# Patient Record
Sex: Female | Born: 1980 | Race: White | Hispanic: No | Marital: Single | Smoking: Former smoker
Health system: Southern US, Community
[De-identification: ages and names within clinical notes are randomized; demographics above are authoritative.]

## PROBLEM LIST (undated history)

## (undated) DIAGNOSIS — Z8709 Personal history of other diseases of the respiratory system: Secondary | ICD-10-CM

## (undated) DIAGNOSIS — M5412 Radiculopathy, cervical region: Secondary | ICD-10-CM

## (undated) DIAGNOSIS — F32A Depression, unspecified: Secondary | ICD-10-CM

## (undated) DIAGNOSIS — Z8719 Personal history of other diseases of the digestive system: Secondary | ICD-10-CM

## (undated) DIAGNOSIS — M47812 Spondylosis without myelopathy or radiculopathy, cervical region: Secondary | ICD-10-CM

## (undated) DIAGNOSIS — F419 Anxiety disorder, unspecified: Secondary | ICD-10-CM

## (undated) DIAGNOSIS — G9520 Unspecified cord compression: Secondary | ICD-10-CM

## (undated) DIAGNOSIS — K219 Gastro-esophageal reflux disease without esophagitis: Secondary | ICD-10-CM

## (undated) DIAGNOSIS — G950 Syringomyelia and syringobulbia: Secondary | ICD-10-CM

## (undated) DIAGNOSIS — K649 Unspecified hemorrhoids: Secondary | ICD-10-CM

## (undated) DIAGNOSIS — E559 Vitamin D deficiency, unspecified: Secondary | ICD-10-CM

## (undated) DIAGNOSIS — G43909 Migraine, unspecified, not intractable, without status migrainosus: Secondary | ICD-10-CM

## (undated) DIAGNOSIS — E781 Pure hyperglyceridemia: Secondary | ICD-10-CM

## (undated) DIAGNOSIS — F329 Major depressive disorder, single episode, unspecified: Secondary | ICD-10-CM

## (undated) DIAGNOSIS — M5020 Other cervical disc displacement, unspecified cervical region: Secondary | ICD-10-CM

## (undated) DIAGNOSIS — Z8639 Personal history of other endocrine, nutritional and metabolic disease: Secondary | ICD-10-CM

## (undated) DIAGNOSIS — K2970 Gastritis, unspecified, without bleeding: Secondary | ICD-10-CM

## (undated) DIAGNOSIS — S22000A Wedge compression fracture of unspecified thoracic vertebra, initial encounter for closed fracture: Secondary | ICD-10-CM

## (undated) DIAGNOSIS — B9681 Helicobacter pylori [H. pylori] as the cause of diseases classified elsewhere: Secondary | ICD-10-CM

## (undated) DIAGNOSIS — M47816 Spondylosis without myelopathy or radiculopathy, lumbar region: Secondary | ICD-10-CM

## (undated) DIAGNOSIS — O159 Eclampsia, unspecified as to time period: Secondary | ICD-10-CM

## (undated) DIAGNOSIS — IMO0002 Reserved for concepts with insufficient information to code with codable children: Secondary | ICD-10-CM

## (undated) DIAGNOSIS — E119 Type 2 diabetes mellitus without complications: Secondary | ICD-10-CM

## (undated) HISTORY — DX: Radiculopathy, cervical region: M54.12

## (undated) HISTORY — DX: Spondylosis without myelopathy or radiculopathy, cervical region: M47.812

## (undated) HISTORY — DX: Personal history of other diseases of the respiratory system: Z87.09

## (undated) HISTORY — DX: Reserved for concepts with insufficient information to code with codable children: IMO0002

## (undated) HISTORY — DX: Syringomyelia and syringobulbia: G95.0

## (undated) HISTORY — DX: Major depressive disorder, single episode, unspecified: F32.9

## (undated) HISTORY — DX: Spondylosis without myelopathy or radiculopathy, lumbar region: M47.816

## (undated) HISTORY — DX: Wedge compression fracture of unspecified thoracic vertebra, initial encounter for closed fracture: S22.000A

## (undated) HISTORY — DX: Eclampsia, unspecified as to time period: O15.9

## (undated) HISTORY — DX: Helicobacter pylori (H. pylori) as the cause of diseases classified elsewhere: B96.81

## (undated) HISTORY — DX: Pure hyperglyceridemia: E78.1

## (undated) HISTORY — DX: Migraine, unspecified, not intractable, without status migrainosus: G43.909

## (undated) HISTORY — DX: Depression, unspecified: F32.A

## (undated) HISTORY — DX: Vitamin D deficiency, unspecified: E55.9

## (undated) HISTORY — DX: Type 2 diabetes mellitus without complications: E11.9

## (undated) HISTORY — DX: Personal history of other endocrine, nutritional and metabolic disease: Z86.39

## (undated) HISTORY — DX: Gastritis, unspecified, without bleeding: K29.70

## (undated) HISTORY — DX: Personal history of other diseases of the digestive system: Z87.19

## (undated) HISTORY — DX: Unspecified cord compression: G95.20

## (undated) HISTORY — PX: WISDOM TOOTH EXTRACTION: SHX21

---
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
## 1998-01-27 ENCOUNTER — Inpatient Hospital Stay (HOSPITAL_COMMUNITY): Admission: AD | Admit: 1998-01-27 | Discharge: 1998-01-27 | Payer: Self-pay | Admitting: Obstetrics and Gynecology

## 1998-01-30 ENCOUNTER — Ambulatory Visit (HOSPITAL_COMMUNITY): Admission: RE | Admit: 1998-01-30 | Discharge: 1998-01-30 | Payer: Self-pay | Admitting: Obstetrics and Gynecology

## 1999-09-24 ENCOUNTER — Other Ambulatory Visit: Admission: RE | Admit: 1999-09-24 | Discharge: 1999-09-24 | Payer: Self-pay | Admitting: Obstetrics and Gynecology

## 1999-12-25 ENCOUNTER — Inpatient Hospital Stay (HOSPITAL_COMMUNITY): Admission: AD | Admit: 1999-12-25 | Discharge: 1999-12-25 | Payer: Self-pay | Admitting: Obstetrics and Gynecology

## 2000-02-19 ENCOUNTER — Encounter: Admission: RE | Admit: 2000-02-19 | Discharge: 2000-05-19 | Payer: Self-pay | Admitting: Obstetrics and Gynecology

## 2000-04-10 ENCOUNTER — Inpatient Hospital Stay (HOSPITAL_COMMUNITY): Admission: AD | Admit: 2000-04-10 | Discharge: 2000-04-12 | Payer: Self-pay | Admitting: Obstetrics and Gynecology

## 2002-02-07 ENCOUNTER — Other Ambulatory Visit: Admission: RE | Admit: 2002-02-07 | Discharge: 2002-02-07 | Payer: Self-pay | Admitting: Obstetrics and Gynecology

## 2002-06-21 ENCOUNTER — Inpatient Hospital Stay (HOSPITAL_COMMUNITY): Admission: AD | Admit: 2002-06-21 | Discharge: 2002-06-21 | Payer: Self-pay | Admitting: Obstetrics and Gynecology

## 2002-07-17 ENCOUNTER — Inpatient Hospital Stay (HOSPITAL_COMMUNITY): Admission: AD | Admit: 2002-07-17 | Discharge: 2002-07-17 | Payer: Self-pay | Admitting: Obstetrics and Gynecology

## 2002-07-27 ENCOUNTER — Inpatient Hospital Stay (HOSPITAL_COMMUNITY): Admission: AD | Admit: 2002-07-27 | Discharge: 2002-07-28 | Payer: Self-pay | Admitting: Obstetrics and Gynecology

## 2002-09-15 ENCOUNTER — Other Ambulatory Visit: Admission: RE | Admit: 2002-09-15 | Discharge: 2002-09-15 | Payer: Self-pay | Admitting: Obstetrics and Gynecology

## 2004-09-20 DIAGNOSIS — M5134 Other intervertebral disc degeneration, thoracic region: Secondary | ICD-10-CM | POA: Insufficient documentation

## 2005-04-07 ENCOUNTER — Other Ambulatory Visit: Admission: RE | Admit: 2005-04-07 | Discharge: 2005-04-07 | Payer: Self-pay | Admitting: Obstetrics and Gynecology

## 2005-08-27 ENCOUNTER — Encounter: Admission: RE | Admit: 2005-08-27 | Discharge: 2005-08-27 | Payer: Self-pay | Admitting: Obstetrics and Gynecology

## 2005-08-28 ENCOUNTER — Ambulatory Visit (HOSPITAL_COMMUNITY): Admission: RE | Admit: 2005-08-28 | Discharge: 2005-08-28 | Payer: Self-pay | Admitting: Obstetrics and Gynecology

## 2005-10-08 ENCOUNTER — Inpatient Hospital Stay (HOSPITAL_COMMUNITY): Admission: AD | Admit: 2005-10-08 | Discharge: 2005-10-11 | Payer: Self-pay | Admitting: Obstetrics and Gynecology

## 2005-10-20 ENCOUNTER — Inpatient Hospital Stay (HOSPITAL_COMMUNITY): Admission: AD | Admit: 2005-10-20 | Discharge: 2005-10-22 | Payer: Self-pay | Admitting: Obstetrics and Gynecology

## 2005-10-20 ENCOUNTER — Encounter: Payer: Self-pay | Admitting: Emergency Medicine

## 2010-05-05 DIAGNOSIS — Z8709 Personal history of other diseases of the respiratory system: Secondary | ICD-10-CM

## 2010-05-05 HISTORY — DX: Personal history of other diseases of the respiratory system: Z87.09

## 2010-10-29 ENCOUNTER — Inpatient Hospital Stay (INDEPENDENT_AMBULATORY_CARE_PROVIDER_SITE_OTHER)
Admission: RE | Admit: 2010-10-29 | Discharge: 2010-10-29 | Disposition: A | Discharge status: Home / Self Care | Payer: Self-pay | Source: Ambulatory Visit | Attending: Emergency Medicine | Admitting: Emergency Medicine

## 2010-10-29 DIAGNOSIS — N760 Acute vaginitis: Secondary | ICD-10-CM

## 2010-10-29 LAB — POCT URINALYSIS DIP (DEVICE)
Protein, ur: NEGATIVE mg/dL
Specific Gravity, Urine: 1.015 (ref 1.005–1.030)

## 2010-10-29 LAB — WET PREP, GENITAL: Yeast Wet Prep HPF POC: NONE SEEN

## 2010-12-06 ENCOUNTER — Inpatient Hospital Stay (INDEPENDENT_AMBULATORY_CARE_PROVIDER_SITE_OTHER)
Admission: RE | Admit: 2010-12-06 | Discharge: 2010-12-06 | Disposition: A | Discharge status: Home / Self Care | Payer: Medicaid Other | Source: Ambulatory Visit | Attending: Emergency Medicine | Admitting: Emergency Medicine

## 2010-12-06 DIAGNOSIS — N898 Other specified noninflammatory disorders of vagina: Secondary | ICD-10-CM

## 2010-12-06 LAB — WET PREP, GENITAL
Trich, Wet Prep: NONE SEEN
Yeast Wet Prep HPF POC: NONE SEEN

## 2010-12-07 LAB — GC/CHLAMYDIA PROBE AMP, GENITAL: GC Probe Amp, Genital: NEGATIVE

## 2011-02-21 DIAGNOSIS — M4800 Spinal stenosis, site unspecified: Secondary | ICD-10-CM | POA: Insufficient documentation

## 2011-12-25 ENCOUNTER — Other Ambulatory Visit (HOSPITAL_COMMUNITY): Payer: Self-pay | Admitting: Neurosurgery

## 2011-12-25 DIAGNOSIS — G950 Syringomyelia and syringobulbia: Secondary | ICD-10-CM

## 2011-12-29 ENCOUNTER — Other Ambulatory Visit (HOSPITAL_COMMUNITY): Payer: Medicaid Other

## 2011-12-31 ENCOUNTER — Ambulatory Visit (HOSPITAL_COMMUNITY)
Admission: RE | Admit: 2011-12-31 | Discharge: 2011-12-31 | Disposition: A | Discharge status: Home / Self Care | Payer: 59 | Source: Ambulatory Visit | Attending: Neurosurgery | Admitting: Neurosurgery

## 2011-12-31 DIAGNOSIS — G950 Syringomyelia and syringobulbia: Secondary | ICD-10-CM

## 2011-12-31 DIAGNOSIS — M4802 Spinal stenosis, cervical region: Secondary | ICD-10-CM | POA: Insufficient documentation

## 2011-12-31 DIAGNOSIS — IMO0002 Reserved for concepts with insufficient information to code with codable children: Secondary | ICD-10-CM | POA: Insufficient documentation

## 2012-08-29 ENCOUNTER — Encounter (HOSPITAL_COMMUNITY): Payer: Self-pay | Admitting: *Deleted

## 2012-08-29 ENCOUNTER — Emergency Department (HOSPITAL_COMMUNITY)
Admission: EM | Admit: 2012-08-29 | Discharge: 2012-08-30 | Disposition: A | Discharge status: Home / Self Care | Payer: 59 | Attending: Emergency Medicine | Admitting: Emergency Medicine

## 2012-08-29 DIAGNOSIS — Z79899 Other long term (current) drug therapy: Secondary | ICD-10-CM | POA: Insufficient documentation

## 2012-08-29 DIAGNOSIS — Z299 Encounter for prophylactic measures, unspecified: Secondary | ICD-10-CM

## 2012-08-29 DIAGNOSIS — G40909 Epilepsy, unspecified, not intractable, without status epilepticus: Secondary | ICD-10-CM | POA: Insufficient documentation

## 2012-08-29 DIAGNOSIS — M79604 Pain in right leg: Secondary | ICD-10-CM

## 2012-08-29 DIAGNOSIS — M79609 Pain in unspecified limb: Secondary | ICD-10-CM | POA: Insufficient documentation

## 2012-08-29 DIAGNOSIS — F17200 Nicotine dependence, unspecified, uncomplicated: Secondary | ICD-10-CM | POA: Insufficient documentation

## 2012-08-29 DIAGNOSIS — I82409 Acute embolism and thrombosis of unspecified deep veins of unspecified lower extremity: Secondary | ICD-10-CM | POA: Insufficient documentation

## 2012-08-29 LAB — POCT I-STAT, CHEM 8
BUN: 5 mg/dL — ABNORMAL LOW (ref 6–23)
Calcium, Ion: 1.16 mmol/L (ref 1.12–1.23)
Chloride: 104 mEq/L (ref 96–112)
Creatinine, Ser: 0.8 mg/dL (ref 0.50–1.10)
Glucose, Bld: 121 mg/dL — ABNORMAL HIGH (ref 70–99)
HCT: 45 % (ref 36.0–46.0)
Hemoglobin: 15.3 g/dL — ABNORMAL HIGH (ref 12.0–15.0)
Potassium: 4 mEq/L (ref 3.5–5.1)
Sodium: 141 mEq/L (ref 135–145)
TCO2: 26 mmol/L (ref 0–100)

## 2012-08-29 LAB — CBC
HCT: 42.3 % (ref 36.0–46.0)
Hemoglobin: 14.9 g/dL (ref 12.0–15.0)
MCH: 28.5 pg (ref 26.0–34.0)
MCHC: 35.2 g/dL (ref 30.0–36.0)
MCV: 81 fL (ref 78.0–100.0)
Platelets: 245 10*3/uL (ref 150–400)
RBC: 5.22 MIL/uL — ABNORMAL HIGH (ref 3.87–5.11)

## 2012-08-29 LAB — PROTIME-INR
INR: 0.92 (ref 0.00–1.49)
Prothrombin Time: 12.3 seconds (ref 11.6–15.2)

## 2012-08-29 MED ORDER — ENOXAPARIN SODIUM 100 MG/ML ~~LOC~~ SOLN
90.0000 mg | SUBCUTANEOUS | Status: AC
  Administered 2012-08-29: 90 mg via SUBCUTANEOUS
  Filled 2012-08-29: qty 1

## 2012-08-29 NOTE — ED Notes (Signed)
The pt has pain and redness x2 to the rt lower leg since yesterday.  The pain has increased since yesterday.

## 2012-08-29 NOTE — ED Provider Notes (Signed)
History    This chart was scribed for Jenna Taylor, a non-physician practitioner working with Jenna Razor, MD by Jenna Taylor, ED Scribe. This patient was seen in room TR11C/TR11C and the patient's care was started at 2237.     CSN: [id number]  Arrival date & time 08/29/12  4098   First MD Initiated Contact with Patient 08/29/12 2218      Chief Complaint  Patient presents with  . Leg Pain    (Consider location/radiation/quality/duration/timing/severity/associated sxs/prior treatment) The history is provided by the patient.   Jenna Taylor is a 32 y.o. female who presents to the Emergency Department complaining of worsening waxing and waning right lower leg pain onset last night while at work last night. Pt reports pain felt like "long charlie horse" lasting for 2 hours this evening while driving to work. Pt denies fever, chest pain, and shortness of breath. Pt denies recent travels, but states she drives 45 minutes to work everyday. Pt reports pain is leg pain is aggravated with ambulation. Pt denies any alleviating factors. Pt reports she's on Mirena for birth control. Pt denies recent surgery, and injuries. Pt denies taking any medications at home to treat pain. Pt reports family hx of blood clots. Pt reports she recently quit smoking.    Past Medical History  Diagnosis Date  . Seizures     History reviewed. No pertinent past surgical history.  No family history on file.  History  Substance Use Topics  . Smoking status: Current Every Day Smoker  . Smokeless tobacco: Not on file  . Alcohol Use: Yes    OB History   Grav Para Term Preterm Abortions TAB SAB Ect Mult Living                  Review of Systems A complete 10 system review of systems was obtained and all systems are negative except as noted in the HPI and PMH.    Allergies  Oxycodone and Penicillins  Home Medications   Current Outpatient Rx  Name  Route  Sig  Dispense  Refill  .  citalopram (CELEXA) 40 MG tablet   Oral   Take 40 mg by mouth daily.         . fexofenadine-pseudoephedrine (ALLEGRA-D 24) 180-240 MG per 24 hr tablet   Oral   Take 1 tablet by mouth daily.         Marland Kitchen HYDROcodone-acetaminophen (VICODIN) 5-500 MG per tablet   Oral   Take 1 tablet by mouth every 6 (six) hours as needed for pain.         . meloxicam (MOBIC) 15 MG tablet   Oral   Take 15 mg by mouth daily as needed for pain.         . metaxalone (SKELAXIN) 800 MG tablet   Oral   Take 800 mg by mouth 4 (four) times daily as needed for pain.           BP 129/76  Pulse 101  Temp(Src) 99.2 F (37.3 C)  Resp 18  SpO2 98%  Physical Exam  Nursing note and vitals reviewed. Constitutional: She is oriented to person, place, and time. She appears well-developed and well-nourished. No distress.  HENT:  Head: Normocephalic and atraumatic.  Eyes: EOM are normal.  Neck: Neck supple. No tracheal deviation present.  Cardiovascular: Normal rate and intact distal pulses.   Pulmonary/Chest: Effort normal. No respiratory distress.  Musculoskeletal: Normal range of motion. She exhibits edema and tenderness.  Tenderness of lower left leg and worse laterally where there is mild edema. No erythema or warmth noted.    Neurological: She is alert and oriented to person, place, and time. She has normal strength. No sensory deficit.  Pt ambulates well with normal gait in room.   Skin: Skin is warm and dry.  Psychiatric: She has a normal mood and affect. Her behavior is normal.    ED Course  Procedures (including critical care time) Medications - No data to display  Labs Reviewed  CBC - Abnormal; Notable for the following:    WBC 11.4 (*)    RBC 5.22 (*)    All other components within normal limits  POCT I-STAT, CHEM 8 - Abnormal; Notable for the following:    BUN 5 (*)    Glucose, Bld 121 (*)    Hemoglobin 15.3 (*)    All other components within normal limits  PROTIME-INR   No  results found.   1. DVT prophylaxis   2. Leg pain, right       MDM  Concern for DVT- lovenox given. Labs obtained. She will return tomorrow morning for LE venous duplex US. No concern for cellulitis- no erythema, warmth. Return precautions discussed. Patient states understanding of plan and is agreeable.     I personally performed the services described in this documentation, which was scribed in my presence. The recorded information has been reviewed and is accurate.   Trevor Mace, PA-C 08/29/12 2337

## 2012-08-30 ENCOUNTER — Telehealth: Payer: Self-pay | Admitting: Family Medicine

## 2012-08-30 ENCOUNTER — Ambulatory Visit (HOSPITAL_COMMUNITY)
Admission: RE | Admit: 2012-08-30 | Discharge: 2012-08-30 | Disposition: A | Discharge status: Home / Self Care | Payer: 59 | Source: Ambulatory Visit | Attending: Emergency Medicine | Admitting: Emergency Medicine

## 2012-08-30 ENCOUNTER — Other Ambulatory Visit (HOSPITAL_COMMUNITY): Payer: Self-pay | Admitting: Emergency Medicine

## 2012-08-30 DIAGNOSIS — M25561 Pain in right knee: Secondary | ICD-10-CM

## 2012-08-30 DIAGNOSIS — M7989 Other specified soft tissue disorders: Secondary | ICD-10-CM

## 2012-08-30 DIAGNOSIS — M25569 Pain in unspecified knee: Secondary | ICD-10-CM | POA: Insufficient documentation

## 2012-08-30 DIAGNOSIS — M79609 Pain in unspecified limb: Secondary | ICD-10-CM | POA: Insufficient documentation

## 2012-08-30 NOTE — ED Notes (Signed)
Pt. Aware she is to return in am for Lower Extremity Venous Duplex of her Right leg.

## 2012-08-30 NOTE — Progress Notes (Signed)
*  Preliminary Results* Right lower extremity venous duplex completed. Right lower extremity is negative for deep vein thrombosis. No evidence of right Baker's cyst.  08/30/2012 10:12 AM Gertie Fey, RDMS, RDCS

## 2012-08-30 NOTE — Telephone Encounter (Signed)
Pt aware to contact neurologist since there was no evidence of dvt. If they are unable to see then i advised patient we will try and schedule her for one day this week for evaluation.

## 2012-08-31 NOTE — ED Provider Notes (Signed)
Medical screening examination/treatment/procedure(s) were performed by non-physician practitioner and as supervising physician I was immediately available for consultation/collaboration.  Tashonna Descoteaux, MD 08/31/12 0134 

## 2012-09-02 ENCOUNTER — Encounter: Payer: Self-pay | Admitting: Nurse Practitioner

## 2012-09-02 ENCOUNTER — Ambulatory Visit (INDEPENDENT_AMBULATORY_CARE_PROVIDER_SITE_OTHER): Payer: 59 | Admitting: Nurse Practitioner

## 2012-09-02 DIAGNOSIS — M79609 Pain in unspecified limb: Secondary | ICD-10-CM

## 2012-09-02 NOTE — Progress Notes (Signed)
  Subjective:    Patient ID: TEIA FREITAS, female    DOB: [birth year], 32 y.o.   MRN: [id number]  HPI - Patient went to ER Sunday evening with right leg pain and redness. Dx with DVT but no doppler study one until next day. The ER gave her a shot of Lovenox shile there. Was given no other meds. Doppler study done Monday showed no evidence of DVT. Still having some leg pain- More of a tingling feeling when sitting but pain lateral lower leg when walking.    Review of Systems  Constitutional: Negative.   HENT: Negative.   Respiratory: Negative.   Cardiovascular: Negative.   Musculoskeletal: Negative.        Objective:   Physical Exam  Constitutional: She is oriented to person, place, and time. She appears well-developed and well-nourished.  Cardiovascular: Normal rate and normal heart sounds.   Pulses:      Posterior tibial pulses are 2+ on the right side, and 2+ on the left side.  Brisk cap refill bil feet/toes  Pulmonary/Chest: Effort normal and breath sounds normal.  Neurological: She is alert and oriented to person, place, and time. She has normal reflexes.  Skin: No erythema.  BP 119/74  Pulse 99  Temp(Src) 98.9 F (37.2 C) (Oral)  Ht 5\' 7"  (1.702 m)  Wt 217 lb (98.431 kg)  BMI 33.98 kg/m2         Assessment & Plan:  Right lower ext pain  ASA OTC daily  Keep F/U appointmnet with neurosurgeon- D/T back problems- Leg pain may be related  Elevate leg if swelling  Reviewed all hospital records  Filled out FMLA papers  Mary-Margaret Daphine Deutscher, FNP

## 2013-03-10 ENCOUNTER — Other Ambulatory Visit: Payer: Self-pay

## 2013-07-28 ENCOUNTER — Ambulatory Visit (INDEPENDENT_AMBULATORY_CARE_PROVIDER_SITE_OTHER): Payer: Managed Care, Other (non HMO) | Admitting: Family Medicine

## 2013-07-28 ENCOUNTER — Encounter: Payer: Self-pay | Admitting: Family Medicine

## 2013-07-28 VITALS — BP 124/85 | HR 116 | Temp 98.6°F | Resp 18 | Ht 67.0 in | Wt 227.0 lb

## 2013-07-28 DIAGNOSIS — J309 Allergic rhinitis, unspecified: Secondary | ICD-10-CM

## 2013-07-28 DIAGNOSIS — J019 Acute sinusitis, unspecified: Secondary | ICD-10-CM

## 2013-07-28 DIAGNOSIS — J209 Acute bronchitis, unspecified: Secondary | ICD-10-CM | POA: Insufficient documentation

## 2013-07-28 MED ORDER — FLUTICASONE PROPIONATE 50 MCG/ACT NA SUSP: 2.0000 | Freq: Every day | NASAL | Status: DC

## 2013-07-28 MED ORDER — PREDNISONE 20 MG PO TABS: 20.0000 mg | ORAL_TABLET | Freq: Two times a day (BID) | ORAL | Status: DC

## 2013-07-28 MED ORDER — DOXYCYCLINE HYCLATE 100 MG PO CAPS: 100.0000 mg | ORAL_CAPSULE | Freq: Two times a day (BID) | ORAL | Status: DC

## 2013-07-28 NOTE — Progress Notes (Signed)
Office Note 07/28/2013  CC:  Chief Complaint  Patient presents with  . Establish Care  . Nasal Congestion    x Tuesday  . Otalgia    HPI:  Jenna Taylor is a 33 y.o. White female who is here to establish care and discuss sinus symptoms. Patient's most recent primary MD: [facility].  Neuro: Dr. Lovell Sheehan.  GYN: Dr. Arelia Sneddon. Old records in EP[facility] were reviewed prior to or during today's visit.  Onset 2 days ago, ST, ears hurt, sneezing, coughing, runny/stuffy nose, face hurts.  No fever.   No persistent wheezing or SOB.  OTC advil cold/sinus not helping.  No nasal spray. She does have upper teeth pain x 24h. Had mild seasonal allergy sx's for the 2-3 weeks prior.   Past Medical History  Diagnosis Date  . Seizures   . Depression   . Migraine   . Eclampsia   . Degenerative disc disease     History reviewed. No pertinent past surgical history.  Family History  Problem Relation Age of Onset  . Depression Mother   . COPD Father   . Heart disease Father   . Clotting disorder Brother   . Osteoporosis Maternal Grandmother   . Cancer Maternal Grandfather   . Kidney disease Maternal Grandfather   . Heart disease Maternal Grandfather   . Arthritis Paternal Grandmother   . Cancer Paternal Grandmother   . Cancer Paternal Grandfather     History   Social History  . Marital Status: Single    Spouse Name: N/A    Number of Children: N/A  . Years of Education: N/A   Occupational History  . Not on file.   Social History Main Topics  . Smoking status: Current Every Day Smoker    Types: E-cigarettes  . Smokeless tobacco: Never Used  . Alcohol Use: Yes     Comment: socially  . Drug Use: No  . Sexual Activity: Yes    Birth Control/ Protection: IUD   Other Topics Concern  . Not on file   Social History Narrative  . No narrative on file    Outpatient Encounter Prescriptions as of 07/28/2013  Medication Sig  . levonorgestrel (MIRENA) 20 MCG/24HR IUD  1 each by Intrauterine route once.  . meloxicam (MOBIC) 15 MG tablet Take 15 mg by mouth daily as needed for pain.  . metaxalone (SKELAXIN) 800 MG tablet Take 800 mg by mouth 4 (four) times daily as needed for pain.  . citalopram (CELEXA) 40 MG tablet Take 40 mg by mouth daily.  . fexofenadine-pseudoephedrine (ALLEGRA-D 24) 180-240 MG per 24 hr tablet Take 1 tablet by mouth daily.  Marland Kitchen HYDROcodone-acetaminophen (VICODIN) 5-500 MG per tablet Take 1 tablet by mouth every 6 (six) hours as needed for pain.    Allergies  Allergen Reactions  . Oxycodone Nausea And Vomiting  . Penicillins Rash    ROS See above HPI PE; Blood pressure 124/85, pulse 116, temperature 98.6 F (37 C), temperature source Temporal, resp. rate 18, height 5\' 7"  (1.702 m), weight 227 lb (102.967 kg), SpO2 98.00%. VS: noted--normal. Gen: alert, NAD, NONTOXIC APPEARING. HEENT: eyes without injection, drainage, or swelling.  Ears: EACs clear, TMs with normal light reflex and landmarks.  Nose: Clear rhinorrhea, with some dried, crusty exudate adherent to mildly injected mucosa.  No purulent d/c.  Bilat paranasal sinus TTP.  No facial swelling.  Throat and mouth without focal lesion.  No pharyngial swelling, erythema, or exudate.  Some clear PND is  noted. Neck: supple, no LAD.   LUNGS: CTA bilat, nonlabored resps.  Mild decrease aeration on exhalation, some post-exhalation coughing. CV: RRR, no m/r/g. EXT: no c/c/e SKIN: no rash  Pertinent labs:  None today  ASSESSMENT AND PLAN:   New pt: obtain old records.  Allergic rhinitis, with superimposed acute infectious sinusitis and acute asthmatic bronchitis. Continue allegra but change to plain, not "D". Add flonase daily, saline nasal spray prn. Doxycycline 100mg  bid x 10d, prednisone 40mg  qd x 5d. Robitussin DM or mucinex DM recommended.  An After Visit Summary was printed and given to the patient.  F/u prn.

## 2013-07-28 NOTE — Progress Notes (Signed)
Pre visit review using our clinic review tool, if applicable. No additional management support is needed unless otherwise documented below in the visit note. 

## 2013-07-29 ENCOUNTER — Telehealth: Payer: Self-pay | Admitting: Family Medicine

## 2013-07-29 NOTE — Telephone Encounter (Signed)
Relevant patient education assigned to patient using Emmi. ° °

## 2013-10-20 ENCOUNTER — Other Ambulatory Visit: Payer: Self-pay | Admitting: Family Medicine

## 2013-10-21 ENCOUNTER — Ambulatory Visit (INDEPENDENT_AMBULATORY_CARE_PROVIDER_SITE_OTHER): Payer: Managed Care, Other (non HMO) | Admitting: Nurse Practitioner

## 2013-10-21 ENCOUNTER — Encounter: Payer: Self-pay | Admitting: Nurse Practitioner

## 2013-10-21 VITALS — BP 139/89 | HR 95 | Temp 98.6°F | Ht 67.0 in | Wt 235.0 lb

## 2013-10-21 DIAGNOSIS — H10029 Other mucopurulent conjunctivitis, unspecified eye: Secondary | ICD-10-CM

## 2013-10-21 DIAGNOSIS — L03319 Cellulitis of trunk, unspecified: Secondary | ICD-10-CM

## 2013-10-21 DIAGNOSIS — H1033 Unspecified acute conjunctivitis, bilateral: Secondary | ICD-10-CM

## 2013-10-21 DIAGNOSIS — L02219 Cutaneous abscess of trunk, unspecified: Secondary | ICD-10-CM

## 2013-10-21 MED ORDER — POLYMYXIN B-TRIMETHOPRIM 10000-0.1 UNIT/ML-% OP SOLN: 1.0000 [drp] | OPHTHALMIC | Status: DC

## 2013-10-21 MED ORDER — DOXYCYCLINE HYCLATE 100 MG PO TABS: 100.0000 mg | ORAL_TABLET | Freq: Two times a day (BID) | ORAL | Status: DC

## 2013-10-21 NOTE — Progress Notes (Signed)
Pre visit review using our clinic review tool, if applicable. No additional management support is needed unless otherwise documented below in the visit note. 

## 2013-10-21 NOTE — Progress Notes (Signed)
   Subjective:    Patient ID: Jenna Taylor, female    DOB: Oct 18, 1980, 33 y.o.   MRN: [id number]  Rash This is a new problem. The current episode started in the past 7 days. The problem has been waxing and waning since onset. The affected locations include the abdomen. The rash is characterized by itchiness and redness (pt describes 'boil" states she drained it 2 da, but now red.). She was exposed to nothing. Pertinent negatives include no congestion, cough, diarrhea, eye pain, fatigue, fever, shortness of breath, sore throat or vomiting. Treatments tried: drained, squeezed, expressed yellow drainage. The treatment provided mild relief.  Conjunctivitis  The current episode started more than 2 weeks ago (2 mos in L, few days in r eye). The onset was gradual. The problem occurs continuously. The problem has been gradually worsening. The problem is mild. Nothing relieves the symptoms. Nothing aggravates the symptoms. Associated symptoms include eye itching, rash, eye discharge and eye redness. Pertinent negatives include no fever, no double vision, no photophobia, no abdominal pain, no diarrhea, no vomiting, no congestion, no ear discharge, no headaches, no sore throat, no cough and no eye pain. Both (L worse than r) eyes are affected.The eye pain is not associated with movement.      Review of Systems  Constitutional: Negative for fever, chills, activity change, appetite change and fatigue.  HENT: Negative for congestion, ear discharge, postnasal drip and sore throat.   Eyes: Positive for discharge, redness and itching. Negative for double vision, photophobia and pain.  Respiratory: Negative for cough and shortness of breath.   Gastrointestinal: Negative for vomiting, abdominal pain, diarrhea and abdominal distention.  Skin: Positive for rash.  Neurological: Negative for headaches.       Objective:   Physical Exam  Vitals reviewed. Constitutional: She is oriented to person, place, and  time. She appears well-developed and well-nourished. No distress.  Eyes: EOM are normal. Pupils are equal, round, and reactive to light. Right eye exhibits discharge. Left eye exhibits no discharge.  Injected conjunctiva L>R  Neck: Normal range of motion. Neck supple. No thyromegaly present.  Cardiovascular: Normal rate and regular rhythm.   No murmur heard. Pulmonary/Chest: Effort normal and breath sounds normal. No respiratory distress. She has no wheezes. She has no rales.  Lymphadenopathy:    She has no cervical adenopathy.  Neurological: She is alert and oriented to person, place, and time.  Skin: Skin is warm and dry. Rash noted.  Erythematous indurated hard lesion below navel, 2 cm, central opening w/crusted drainage. No fluctuancy.  Psychiatric: She has a normal mood and affect. Her behavior is normal. Thought content normal.          Assessment & Plan:  1. Cellulitis of trunk, unspecified site - doxycycline (VIBRA-TABS) 100 MG tablet; Take 1 tablet (100 mg total) by mouth 2 (two) times daily.  Dispense: 14 tablet; Refill: 0  2. Acute bacterial conjunctivitis of both eyes - trimethoprim-polymyxin b (POLYTRIM) ophthalmic solution; Place 1 drop into both eyes every 4 (four) hours.  Dispense: 10 mL; Refill: 0  See pt instructions for eye care & skin care. F/u 1 week.

## 2013-10-21 NOTE — Patient Instructions (Signed)
Use drops for 24 hours longer than you have symptoms-usually 7 days. Irrigate eyes with saline eye drops also. Wash lashes with diluted Laural BenesJohnson & Johnson baby shampoo-1:1 solution. Skin Care: wash daily with mild soap-Dove bar. Apply vaseline & cover with band aid. No restrictive clothing. Return in 1 week for re-evaluation.  Bacterial Conjunctivitis Bacterial conjunctivitis, commonly called pink eye, is an inflammation of the clear membrane that covers the white part of the eye (conjunctiva). The inflammation can also happen on the underside of the eyelids. The blood vessels in the conjunctiva become inflamed causing the eye to become red or pink. Bacterial conjunctivitis may spread easily from one eye to another and from person to person (contagious).  CAUSES  Bacterial conjunctivitis is caused by bacteria. The bacteria may come from your own skin, your upper respiratory tract, or from someone else with bacterial conjunctivitis. SYMPTOMS  The normally white color of the eye or the underside of the eyelid is usually pink or red. The pink eye is usually associated with irritation, tearing, and some sensitivity to light. Bacterial conjunctivitis is often associated with a thick, yellowish discharge from the eye. The discharge may turn into a crust on the eyelids overnight, which causes your eyelids to stick together. If a discharge is present, there may also be some blurred vision in the affected eye. DIAGNOSIS  Bacterial conjunctivitis is diagnosed by your caregiver through an eye exam and the symptoms that you report. Your caregiver looks for changes in the surface tissues of your eyes, which may point to the specific type of conjunctivitis. A sample of any discharge may be collected on a cotton-tip swab if you have a severe case of conjunctivitis, if your cornea is affected, or if you keep getting repeat infections that do not respond to treatment. The sample will be sent to a lab to see if the  inflammation is caused by a bacterial infection and to see if the infection will respond to antibiotic medicines. TREATMENT   Bacterial conjunctivitis is treated with antibiotics. Antibiotic eyedrops are most often used. However, antibiotic ointments are also available. Antibiotics pills are sometimes used. Artificial tears or eye washes may ease discomfort. HOME CARE INSTRUCTIONS   To ease discomfort, apply a cool, clean wash cloth to your eye for 10 20 minutes, 3 4 times a day.  Gently wipe away any drainage from your eye with a warm, wet washcloth or a cotton ball.  Wash your hands often with soap and water. Use paper towels to dry your hands.  Do not share towels or wash cloths. This may spread the infection.  Change or wash your pillow case every day.  You should not use eye makeup until the infection is gone.  Do not operate machinery or drive if your vision is blurred.  Stop using contacts lenses. Ask your caregiver how to sterilize or replace your contacts before using them again. This depends on the type of contact lenses that you use.  When applying medicine to the infected eye, do not touch the edge of your eyelid with the eyedrop bottle or ointment tube. SEEK IMMEDIATE MEDICAL CARE IF:   Your infection has not improved within 3 days after beginning treatment.  You had yellow discharge from your eye and it returns.  You have increased eye pain.  Your eye redness is spreading.  Your vision becomes blurred.  You have a fever or persistent symptoms for more than 2 3 days.  You have a fever and your  symptoms suddenly get worse.  You have facial pain, redness, or swelling. MAKE SURE YOU:   Understand these instructions.  Will watch your condition.  Will get help right away if you are not doing well or get worse. Document Released: 04/21/2005 Document Revised: 01/14/2012 Document Reviewed: 09/22/2011 [facility] Patient Information 2014 [employer]  [place]. Cellulitis Cellulitis is an infection of the skin and the tissue beneath it. The infected area is usually red and tender. Cellulitis occurs most often in the arms and lower legs.  CAUSES  Cellulitis is caused by bacteria that enter the skin through cracks or cuts in the skin. The most common types of bacteria that cause cellulitis are Staphylococcus and Streptococcus. SYMPTOMS   Redness and warmth.  Swelling.  Tenderness or pain.  Fever. DIAGNOSIS  Your caregiver can usually determine what is wrong based on a physical exam. Blood tests may also be done. TREATMENT  Treatment usually involves taking an antibiotic medicine. HOME CARE INSTRUCTIONS   Take your antibiotics as directed. Finish them even if you start to feel better.  Keep the infected arm or leg elevated to reduce swelling.  Apply a warm cloth to the affected area up to 4 times per day to relieve pain.  Only take over-the-counter or prescription medicines for pain, discomfort, or fever as directed by your caregiver.  Keep all follow-up appointments as directed by your caregiver. SEEK MEDICAL CARE IF:   You notice red streaks coming from the infected area.  Your red area gets larger or turns dark in color.  Your bone or joint underneath the infected area becomes painful after the skin has healed.  Your infection returns in the same area or another area.  You notice a swollen bump in the infected area.  You develop new symptoms. SEEK IMMEDIATE MEDICAL CARE IF:   You have a fever.  You feel very sleepy.  You develop vomiting or diarrhea.  You have a general ill feeling (malaise) with muscle aches and pains. MAKE SURE YOU:   Understand these instructions.  Will watch your condition.  Will get help right away if you are not doing well or get worse. Document Released: 01/29/2005 Document Revised: 10/21/2011 Document Reviewed: 07/07/2011 [facility]Exit[facility] Patient Information 2015 [place], [place]. This  information is not intended to replace advice given to you by your health care provider. Make sure you discuss any questions you have with your health care provider.

## 2013-10-27 ENCOUNTER — Ambulatory Visit: Payer: Managed Care, Other (non HMO) | Admitting: Family Medicine

## 2013-12-16 ENCOUNTER — Encounter: Payer: Self-pay | Admitting: Family Medicine

## 2014-07-18 ENCOUNTER — Ambulatory Visit (INDEPENDENT_AMBULATORY_CARE_PROVIDER_SITE_OTHER): Payer: PRIVATE HEALTH INSURANCE | Admitting: Family Medicine

## 2014-07-18 ENCOUNTER — Encounter: Payer: Self-pay | Admitting: *Deleted

## 2014-07-18 ENCOUNTER — Encounter: Payer: Self-pay | Admitting: Family Medicine

## 2014-07-18 VITALS — BP 120/70 | HR 86 | Temp 98.4°F | Ht 67.0 in | Wt 225.0 lb

## 2014-07-18 DIAGNOSIS — J0141 Acute recurrent pansinusitis: Secondary | ICD-10-CM

## 2014-07-18 DIAGNOSIS — H04551 Acquired stenosis of right nasolacrimal duct: Secondary | ICD-10-CM

## 2014-07-18 DIAGNOSIS — F411 Generalized anxiety disorder: Secondary | ICD-10-CM

## 2014-07-18 DIAGNOSIS — I889 Nonspecific lymphadenitis, unspecified: Secondary | ICD-10-CM

## 2014-07-18 MED ORDER — METAXALONE 800 MG PO TABS: 800.0000 mg | ORAL_TABLET | Freq: Four times a day (QID) | ORAL | Status: DC | PRN

## 2014-07-18 MED ORDER — FLUTICASONE PROPIONATE 50 MCG/ACT NA SUSP: 2.0000 | Freq: Every day | NASAL | Status: DC

## 2014-07-18 MED ORDER — DOXYCYCLINE HYCLATE 100 MG PO TABS: 100.0000 mg | ORAL_TABLET | Freq: Two times a day (BID) | ORAL | Status: DC

## 2014-07-18 MED ORDER — CITALOPRAM HYDROBROMIDE 40 MG PO TABS: 40.0000 mg | ORAL_TABLET | Freq: Every day | ORAL | Status: DC

## 2014-07-18 NOTE — Progress Notes (Signed)
Pre visit review using our clinic review tool, if applicable. No additional management support is needed unless otherwise documented below in the visit note. 

## 2014-07-18 NOTE — Patient Instructions (Signed)
Saline nasal spray to rinse your nasal passages out TWICE a day every day.  Remember to start your celexa (citalopram) at 1/2 tab once a day for 3 wks and then you may increase to whole tab if needed.

## 2014-07-18 NOTE — Progress Notes (Signed)
OFFICE NOTE  07/18/2014  CC:  Chief Complaint  Patient presents with  . Sore Throat   HPI: Patient is a 34 y.o. Caucasian female who is here for sore throat. Onset of ST about 3-4 days ago, feels like neck glands swelling-almost exclusively the left side.  Says she has had sinus pressure, ears popping, taking advil cold and sinus daily "forever". +HA.  No fever.  Some body aches last few days.  No rash.  No cough.  For last 6 wks or so has had troubles with significant anxiety since death of her father, also excessive stress overall in her life.  No SI or HI.  She has done well on celexa but weened herself off of it when she had been in remission.  She wants to restart this.  Pertinent PMH:  Past medical, surgical, social, and family history reviewed and no changes are noted since last office visit.  MEDS: Not taking doxy listed below, also ran out of flonase Outpatient Prescriptions Prior to Visit  Medication Sig Dispense Refill  . fexofenadine-pseudoephedrine (ALLEGRA-D 24) 180-240 MG per 24 hr tablet Take 1 tablet by mouth daily.    . fluticasone (FLONASE) 50 MCG/ACT nasal spray Place 2 sprays into both nostrils daily. 16 g 6  . levonorgestrel (MIRENA) 20 MCG/24HR IUD 1 each by Intrauterine route once.    . metaxalone (SKELAXIN) 800 MG tablet Take 800 mg by mouth 4 (four) times daily as needed for pain.    Marland Kitchen. doxycycline (VIBRA-TABS) 100 MG tablet Take 1 tablet (100 mg total) by mouth 2 (two) times daily. (Patient not taking: Reported on 07/18/2014) 14 tablet 0  . meloxicam (MOBIC) 15 MG tablet Take 15 mg by mouth daily as needed for pain.    Marland Kitchen. trimethoprim-polymyxin b (POLYTRIM) ophthalmic solution Place 1 drop into both eyes every 4 (four) hours. (Patient not taking: Reported on 07/18/2014) 10 mL 0   No facility-administered medications prior to visit.    PE: Blood pressure 120/70, pulse 86, temperature 98.4 F (36.9 C), temperature source Oral, height 5\' 7"  (1.702 m), weight 225  lb (102.059 kg), SpO2 97 %. VS: noted--normal. Gen: alert, NAD, NONTOXIC APPEARING. HEENT: eyes without injection, drainage, or swelling.  Ears: EACs clear, TMs with normal light reflex and landmarks.  TMs retracted R>L.  Nose: Clear rhinorrhea, with some dried, crusty exudate adherent to mildly injected mucosa.  No purulent d/c.  Bilat paranasal sinus TTP.  No facial swelling.  Throat and mouth without focal lesion.  No pharyngial swelling, erythema, or exudate.   Neck: L>R anterior cervical LAD, tender on left--no single distinct/dominant node is palpable.  No overlying skin changes. LUNGS: CTA bilat, nonlabored resps.   CV: RRR, no m/r/g. EXT: no c/c/e SKIN: no rash  LABS: none  IMPRESSION AND PLAN:  1) Acute recurrent pansinusitis, with right nasolacrimal duct obstruction. Get back on flonase, continue antihist/decong, take doxy 100 mg bid x 10d. Milking of right nasolacrimal duct was demonstrated/recommended + warm compresses to the area prn. +Hx of pansinusitis showing up on MRI of neck in past.  May need to see ENT if not improved at f/u.  2) Left anterior cervical lymphadenitis: doxy 100mg  bid x 10d. Call if not resolved in 10d.  3) GAD, re-exacerbated lately with extra job stress + relatively recent death of her father. Restart citalopram at 1/2 of 40mg  tab qd, increase to whole tab in 3 wks prn.  An After Visit Summary was printed and given to the patient.  FOLLOW UP: 4 wks

## 2014-07-19 ENCOUNTER — Telehealth: Payer: Self-pay | Admitting: Family Medicine

## 2014-07-19 NOTE — Telephone Encounter (Signed)
emmi emailed °

## 2014-08-14 ENCOUNTER — Encounter: Payer: Self-pay | Admitting: Family Medicine

## 2014-08-14 ENCOUNTER — Ambulatory Visit (INDEPENDENT_AMBULATORY_CARE_PROVIDER_SITE_OTHER): Payer: PRIVATE HEALTH INSURANCE | Admitting: Family Medicine

## 2014-08-14 VITALS — BP 112/62 | HR 96 | Temp 98.9°F | Ht 67.0 in | Wt 226.0 lb

## 2014-08-14 DIAGNOSIS — J301 Allergic rhinitis due to pollen: Secondary | ICD-10-CM | POA: Diagnosis not present

## 2014-08-14 DIAGNOSIS — I889 Nonspecific lymphadenitis, unspecified: Secondary | ICD-10-CM | POA: Insufficient documentation

## 2014-08-14 DIAGNOSIS — F411 Generalized anxiety disorder: Secondary | ICD-10-CM | POA: Diagnosis not present

## 2014-08-14 DIAGNOSIS — J0141 Acute recurrent pansinusitis: Secondary | ICD-10-CM

## 2014-08-14 NOTE — Progress Notes (Signed)
OFFICE NOTE  08/14/2014  CC:  Chief Complaint  Patient presents with  . Follow-up    4 weeks     HPI: Patient is a 34 y.o. Caucasian female who is here for 1 mo f/u GAD and sinusitis. Doing much better from a mood/stress/anxiety standpoint since getting back on citalopram, now at 40mg  qd dosing, no side effects. Feels a lot better regarding her nasal congestion and sinus pressure: just a little pressure that started again a few days ago.  Right tear duct obst is gone.   Anterior neck region began to feel sore again for a couple days, seems on and off, no fevers.   Pertinent PMH:  Past medical, surgical, social, and family history reviewed and no changes are noted since last office visit.  MEDS: Claritin without D instead of allegra D She has finished the doxy listed below Outpatient Prescriptions Prior to Visit  Medication Sig Dispense Refill  . citalopram (CELEXA) 40 MG tablet Take 1 tablet (40 mg total) by mouth daily. 30 tablet 3  . fexofenadine-pseudoephedrine (ALLEGRA-D 24) 180-240 MG per 24 hr tablet Take 1 tablet by mouth daily.    . fluticasone (FLONASE) 50 MCG/ACT nasal spray Place 2 sprays into both nostrils daily. 16 g 12  . levonorgestrel (MIRENA) 20 MCG/24HR IUD 1 each by Intrauterine route once.    . metaxalone (SKELAXIN) 800 MG tablet Take 1 tablet (800 mg total) by mouth 4 (four) times daily as needed. 120 tablet 5  . doxycycline (VIBRA-TABS) 100 MG tablet Take 1 tablet (100 mg total) by mouth 2 (two) times daily. (Patient not taking: Reported on 08/14/2014) 20 tablet 0   No facility-administered medications prior to visit.    PE: Blood pressure 112/62, pulse 96, temperature 98.9 F (37.2 C), temperature source Oral, height 5\' 7"  (1.702 m), weight 226 lb (102.513 kg), SpO2 97 %. VS: noted--normal. Gen: alert, NAD, NONTOXIC APPEARING. HEENT: eyes without injection, drainage, or swelling.  Ears: EACs clear, TMs with normal light reflex and landmarks.  Nose: Clear  rhinorrhea, with some dried, crusty exudate adherent to mildly injected mucosa.  No purulent d/c.  No paranasal sinus TTP.  No facial swelling.  Throat and mouth without focal lesion.  No pharyngial swelling, erythema, or exudate.   Neck: supple, no LAD.   LUNGS: CTA bilat, nonlabored resps.   CV: RRR, no m/r/g. EXT: no c/c/e SKIN: no rash   IMPRESSION AND PLAN:  1) GAD, much improved.  The current medical regimen is effective;  continue present plan and medications.  2) Acute sinusitis with acute cerv lymphadenitis: resolved s/p antibiotics. Continuing claritin and flonase daily for allergic rhinitis.  3) Wt gain, hx of vit D def, pt desires "physical labs", but says these must be done at [facility] in [place] so they can be free via her employer/insurer's recommendations.  Wrote rx for HP labs + vit D level today.  An After Visit Summary was printed and given to the patient.  FOLLOW UP: 6 mo

## 2014-08-14 NOTE — Progress Notes (Signed)
Pre visit review using our clinic review tool, if applicable. No additional management support is needed unless otherwise documented below in the visit note. 

## 2014-11-30 ENCOUNTER — Encounter: Payer: Self-pay | Admitting: Family Medicine

## 2014-11-30 ENCOUNTER — Ambulatory Visit (INDEPENDENT_AMBULATORY_CARE_PROVIDER_SITE_OTHER): Payer: PRIVATE HEALTH INSURANCE | Admitting: Family Medicine

## 2014-11-30 VITALS — BP 119/82 | HR 90 | Temp 98.8°F | Resp 16 | Ht 67.0 in | Wt 227.0 lb

## 2014-11-30 DIAGNOSIS — M5010 Cervical disc disorder with radiculopathy, unspecified cervical region: Secondary | ICD-10-CM | POA: Diagnosis not present

## 2014-11-30 DIAGNOSIS — M47812 Spondylosis without myelopathy or radiculopathy, cervical region: Secondary | ICD-10-CM | POA: Diagnosis not present

## 2014-11-30 MED ORDER — PREDNISONE 20 MG PO TABS
ORAL_TABLET | ORAL | Status: DC
Start: 2014-11-30 — End: 2015-03-22

## 2014-11-30 MED ORDER — HYDROCODONE-ACETAMINOPHEN 10-325 MG PO TABS: 1.0000 | ORAL_TABLET | Freq: Four times a day (QID) | ORAL | Status: DC | PRN

## 2014-11-30 NOTE — Progress Notes (Signed)
OFFICE VISIT  12/01/2014   CC:  Chief Complaint  Patient presents with  . Neck Pain  . Shoulder Pain   HPI:    Patient is a 34 y.o. Caucasian female who presents for neck pain, onset upon awakening 6d ago, worsening and radiating down R arm to just below elbow area.  Tingling intermittently down R arm on medial aspect down into 4th and 5th fingers.  Called her neurosurg office yesterday for appt, Dr. Lovell Sheehan, and she has not gotten any call back yet. Skelaxin, ibuprofen "not touching it at all".   No recent trauma.  No fevers.  Question of mild R arm weakness when she tries to reach out/lift it.   Past Medical History  Diagnosis Date  . Depression     Situational  . Migraine     Quiescent since her mid 67s  . Eclampsia     Seizure 1 wk after delivery of 3rd child.  . Degenerative disc disease   . Cervical spondylosis   . Lumbar spondylosis   . Syrinx of spinal cord     Small thoracic syrinx--"stable over multiple years with multiple MRIs", "no need to worry about any further" per NS, Dr. Lovell Sheehan.  Marland Kitchen NSVD (normal spontaneous vaginal delivery)     x 3  . History of chronic sinusitis 2012  . History of vitamin D deficiency     Pt says she took replacement dosing and then daily maintenance but no vit D level was rechecked    Past Surgical History  Procedure Laterality Date  . Dilation and evacuation  1999    Outpatient Prescriptions Prior to Visit  Medication Sig Dispense Refill  . citalopram (CELEXA) 40 MG tablet Take 1 tablet (40 mg total) by mouth daily. 30 tablet 3  . fexofenadine-pseudoephedrine (ALLEGRA-D 24) 180-240 MG per 24 hr tablet Take 1 tablet by mouth daily.    . fluticasone (FLONASE) 50 MCG/ACT nasal spray Place 2 sprays into both nostrils daily. 16 g 12  . levonorgestrel (MIRENA) 20 MCG/24HR IUD 1 each by Intrauterine route once.    . metaxalone (SKELAXIN) 800 MG tablet Take 1 tablet (800 mg total) by mouth 4 (four) times daily as needed. 120 tablet 5    No facility-administered medications prior to visit.    Allergies  Allergen Reactions  . Penicillins Rash    ROS As per HPI  PE: Blood pressure 119/82, pulse 90, temperature 98.8 F (37.1 C), temperature source Temporal, resp. rate 16, height  (1.702 m), weight 227 lb (102.967 kg), SpO2 96 %. Gen: Alert, well appearing.  Patient is oriented to person, place, time, and situation. ROM of C spine goes to 10 deg flexion, 45 deg exten, rot and lat bending ok. Minimal TTP mm's around R lateral neck region and trap diffusely. No shoulder girdle TTP.  She can aBduct shoulder fine. UE strength and sensation and reflexes intact/symmetric.  Spurling's testing elicits some tingling down R arm with head turned to R.  LABS:  none  IMPRESSION AND PLAN:  Cervical spondylosis, acute flare with R sided UE radiculopathy. Prednisone  qd x 5d, then  qd x 5d. Vicodin 10/325, 1-2 tabs q6h prn (oxycod makes pt too sick), #40. She'll arrange appt with her neurosurgeon ASAP--has been since 2013 since she saw him so it may require new referral--we'll see. Recheck in office in 1 week and we'll see if PT will be needed.  No indication for imaging at this time.  An After Visit Summary  was printed and given to the patient.  FOLLOW UP: Return in about 1 week (around 12/07/2014) for f/u neck pain.

## 2014-11-30 NOTE — Progress Notes (Signed)
Pre visit review using our clinic review tool, if applicable. No additional management support is needed unless otherwise documented below in the visit note. 

## 2014-12-07 ENCOUNTER — Telehealth: Payer: Self-pay | Admitting: Family Medicine

## 2014-12-07 ENCOUNTER — Ambulatory Visit: Payer: PRIVATE HEALTH INSURANCE | Admitting: Family Medicine

## 2014-12-07 NOTE — Telephone Encounter (Signed)
Pt cancelled her f/u appt today. She feels better and has an appt with the Neuro office Aug 18. If you need to see her please let us know and we will reschedule.

## 2014-12-07 NOTE — Telephone Encounter (Signed)
OK. Does not need to reschedule with me.

## 2014-12-07 NOTE — Telephone Encounter (Signed)
FYI

## 2015-03-02 ENCOUNTER — Other Ambulatory Visit: Payer: Self-pay | Admitting: Family Medicine

## 2015-03-05 NOTE — Telephone Encounter (Signed)
RF request for citalopram LOV: 11/30/14 Next ov: None Last written: 07/18/14 #30 w/ 3RF  Needs ov for more refills.

## 2015-03-06 NOTE — Telephone Encounter (Signed)
Left detailed message on cell vm, okay per DPR.  

## 2015-03-22 ENCOUNTER — Ambulatory Visit (INDEPENDENT_AMBULATORY_CARE_PROVIDER_SITE_OTHER): Payer: PRIVATE HEALTH INSURANCE | Admitting: Family Medicine

## 2015-03-22 ENCOUNTER — Encounter: Payer: Self-pay | Admitting: *Deleted

## 2015-03-22 ENCOUNTER — Encounter: Payer: Self-pay | Admitting: Family Medicine

## 2015-03-22 VITALS — BP 124/83 | HR 90 | Temp 98.3°F | Resp 16 | Ht 67.0 in | Wt 231.0 lb

## 2015-03-22 DIAGNOSIS — Z720 Tobacco use: Secondary | ICD-10-CM

## 2015-03-22 DIAGNOSIS — J0101 Acute recurrent maxillary sinusitis: Secondary | ICD-10-CM | POA: Diagnosis not present

## 2015-03-22 DIAGNOSIS — J209 Acute bronchitis, unspecified: Secondary | ICD-10-CM

## 2015-03-22 DIAGNOSIS — F411 Generalized anxiety disorder: Secondary | ICD-10-CM | POA: Diagnosis not present

## 2015-03-22 MED ORDER — PREDNISONE 20 MG PO TABS: ORAL_TABLET | ORAL | Status: DC

## 2015-03-22 MED ORDER — DOXYCYCLINE HYCLATE 100 MG PO TABS: 100.0000 mg | ORAL_TABLET | Freq: Two times a day (BID) | ORAL | Status: DC

## 2015-03-22 MED ORDER — HYDROCODONE-HOMATROPINE 5-1.5 MG/5ML PO SYRP
ORAL_SOLUTION | ORAL | Status: DC
Start: 2015-03-22 — End: 2015-06-27

## 2015-03-22 MED ORDER — CITALOPRAM HYDROBROMIDE 40 MG PO TABS: 40.0000 mg | ORAL_TABLET | Freq: Every day | ORAL | Status: DC

## 2015-03-22 NOTE — Progress Notes (Signed)
OFFICE VISIT  03/22/2015   CC:  Chief Complaint  Patient presents with  . URI    x 1 month, shortness of breath started yesterday (03/21/15)  . Follow-up    Pt is not fasting.    HPI:    Patient is a 34 y.o. Caucasian female who presents for 6 mo anxiety/depression med f/u and ongoing respiratory symptoms. Hacking cough that makes her back hurt around shoulder blades, some SOB during shower yesterday, occ loud wheeze that clears with a cough.  No fever.  Started about a month ago with lots of nasal congestion, facial pressure, L ear stopped up.  She describes tenderness to touch over max sinuses, has frequent frontal HA's lately. Mucinex DM, advil cold/sinus, tylenol cold and flu.  None of this helping.   She still smokes but a pack lasts 3-4 days now.  Says citalopram helping mood/anxiety good still.  No side effects.   Past Medical History  Diagnosis Date  . Depression     Situational  . Migraine     Quiescent since her mid 52s  . Eclampsia     Seizure 1 wk after delivery of 3rd child.  . Degenerative disc disease   . Cervical spondylosis   . Lumbar spondylosis   . Syrinx of spinal cord (HCC)     Small thoracic syrinx--"stable over multiple years with multiple MRIs", "no need to worry about any further" per NS, Dr. Lovell Sheehan.  Marland Kitchen NSVD (normal spontaneous vaginal delivery)     x 3  . History of chronic sinusitis 2012  . History of vitamin D deficiency     Pt says she took replacement dosing and then daily maintenance but no vit D level was rechecked    Past Surgical History  Procedure Laterality Date  . Dilation and evacuation  1999   MEDS: taking claritin w/out D, not allegra D listed below Outpatient Prescriptions Prior to Visit  Medication Sig Dispense Refill  . fluticasone (FLONASE) 50 MCG/ACT nasal spray Place 2 sprays into both nostrils daily. 16 g 12  . HYDROcodone-acetaminophen (NORCO) 10-325 MG per tablet Take 1-2 tablets by mouth every 6 (six) hours as  needed. 40 tablet 0  . levonorgestrel (MIRENA) 20 MCG/24HR IUD 1 each by Intrauterine route once.    . metaxalone (SKELAXIN) 800 MG tablet Take 1 tablet (800 mg total) by mouth 4 (four) times daily as needed. 120 tablet 5  . citalopram (CELEXA) 40 MG tablet TAKE ONE TABLET BY MOUTH ONCE DAILY 30 tablet 0  . fexofenadine-pseudoephedrine (ALLEGRA-D 24) 180-240 MG per 24 hr tablet Take 1 tablet by mouth daily.    . predniSONE (DELTASONE) 20 MG tablet 2 tabs po qd x 5d, then 1 tab po qd x 5d (Patient not taking: Reported on 03/22/2015) 15 tablet 0   No facility-administered medications prior to visit.    Allergies  Allergen Reactions  . Penicillins Rash    ROS As per HPI  PE: Blood pressure 124/83, pulse 90, temperature 98.3 F (36.8 C), temperature source Oral, resp. rate 16, height  (1.702 m), weight 231 lb (104.781 kg), SpO2 96 %. VS: noted--normal. Gen: alert, NAD, NONTOXIC APPEARING. HEENT: eyes without injection, drainage, or swelling.  Ears: EACs clear, TMs with normal light reflex and landmarks.  Nose: Clear rhinorrhea, with some dried, crusty exudate adherent to mildly injected mucosa.  No purulent d/c. Diffuse  paranasal sinus TTP. L>R.  Mild infra-orbital facial swelling bilat.  Throat and mouth without focal lesion.  No pharyngial swelling, erythema, or exudate.   Neck: supple, no LAD.   LUNGS: CTA bilat, nonlabored resps.  Aeration is decent, no signif prolongation of exp phase.  Frequent coughing--rattly-during exam/interview. CV: RRR, no m/r/g. EXT: no c/c/e SKIN: no rash    LABS:  none  IMPRESSION AND PLAN:  1) GAD; The current medical regimen is effective;  continue present plan and medications. RF'd med.  2) Acute sinusitis, recurrent, also with acute bronchitis--no signif sign of RAD, but this is complicated by tobacco abuse. Doxycycline 100 mg bid x 10d. Prednisone 40mg  qd x 5d. Mucinex DM OTC. Hycodan syrup 1-2 tsp qhs prn cough, #120 ml.  Therapeutic  expectations and side effect profile of medication discussed today.  Patient's questions answered. Encouraged pt to stop smoking completely. Pt is UTD on flu vaccine.  An After Visit Summary was printed and given to the patient.   FOLLOW UP: Return in about 6 months (around 09/19/2015) for annual CPE (fasting).

## 2015-03-22 NOTE — Progress Notes (Signed)
Pre visit review using our clinic review tool, if applicable. No additional management support is needed unless otherwise documented below in the visit note. 

## 2015-04-03 ENCOUNTER — Telehealth: Payer: Self-pay | Admitting: Family Medicine

## 2015-04-03 MED ORDER — BENZONATATE 200 MG PO CAPS: 200.0000 mg | ORAL_CAPSULE | Freq: Two times a day (BID) | ORAL | Status: DC | PRN

## 2015-04-03 MED ORDER — PREDNISONE 20 MG PO TABS: ORAL_TABLET | ORAL | Status: DC

## 2015-04-03 NOTE — Telephone Encounter (Signed)
Patient is almost out of cough medicine, has to take at night or can't stop coughing & can't sleep. Patient is out of antibiotic. She is better but not completely. [facility] Please call patient.

## 2015-04-03 NOTE — Telephone Encounter (Signed)
Pt advised and voiced understanding.  Rx's sent as written.

## 2015-04-03 NOTE — Telephone Encounter (Signed)
Please advise. Thanks.  

## 2015-04-03 NOTE — Telephone Encounter (Signed)
Pls eRx prednisone 20mg , 1 tab po qd x 5d, #5, no RF. Also tessalon perles 200mg , 1 tab tid prn cough, #30, RF x 1. Needs to continue either robitussin DM or mucinex DM. No more hydrocodone cough syrup recommended.-thx

## 2015-06-27 ENCOUNTER — Ambulatory Visit (INDEPENDENT_AMBULATORY_CARE_PROVIDER_SITE_OTHER): Payer: PRIVATE HEALTH INSURANCE | Admitting: Family Medicine

## 2015-06-27 ENCOUNTER — Encounter: Payer: Self-pay | Admitting: Family Medicine

## 2015-06-27 VITALS — BP 130/84 | HR 97 | Temp 98.0°F | Resp 16 | Ht 67.0 in | Wt 243.5 lb

## 2015-06-27 DIAGNOSIS — R1011 Right upper quadrant pain: Secondary | ICD-10-CM

## 2015-06-27 DIAGNOSIS — R140 Abdominal distension (gaseous): Secondary | ICD-10-CM | POA: Diagnosis not present

## 2015-06-27 LAB — COMPREHENSIVE METABOLIC PANEL
ALK PHOS: 66 U/L (ref 39–117)
ALT: 21 U/L (ref 0–35)
AST: 14 U/L (ref 0–37)
Albumin: 3.9 g/dL (ref 3.5–5.2)
BUN: 5 mg/dL — ABNORMAL LOW (ref 6–23)
CALCIUM: 8.9 mg/dL (ref 8.4–10.5)
CHLORIDE: 106 meq/L (ref 96–112)
CO2: 29 mEq/L (ref 19–32)
Creatinine, Ser: 0.73 mg/dL (ref 0.40–1.20)
GFR: 95.7 mL/min (ref >60.00)
Glucose, Bld: 126 mg/dL — ABNORMAL HIGH (ref 70–99)
POTASSIUM: 4.2 meq/L (ref 3.5–5.1)
SODIUM: 141 meq/L (ref 135–145)
TOTAL PROTEIN: 6.5 g/dL (ref 6.0–8.3)
Total Bilirubin: 0.4 mg/dL (ref 0.2–1.2)

## 2015-06-27 LAB — CBC WITH DIFFERENTIAL/PLATELET
BASOS PCT: 0.5 % (ref 0.0–3.0)
Basophils Absolute: 0.1 10*3/uL (ref 0.0–0.1)
EOS PCT: 4.5 % (ref 0.0–5.0)
Eosinophils Absolute: 0.6 10*3/uL (ref 0.0–0.7)
HEMATOCRIT: 44.3 % (ref 36.0–46.0)
HEMOGLOBIN: 15 g/dL (ref 12.0–15.0)
Lymphocytes Relative: 19.1 % (ref 12.0–46.0)
Lymphs Abs: 2.6 10*3/uL (ref 0.7–4.0)
MCHC: 33.8 g/dL (ref 30.0–36.0)
MCV: 84.3 fl (ref 78.0–100.0)
MONO ABS: 0.8 10*3/uL (ref 0.1–1.0)
MONOS PCT: 6.1 % (ref 3.0–12.0)
Neutro Abs: 9.4 10*3/uL — ABNORMAL HIGH (ref 1.4–7.7)
Neutrophils Relative %: 69.8 % (ref 43.0–77.0)
Platelets: 267 10*3/uL (ref 150.0–400.0)
RBC: 5.26 Mil/uL — ABNORMAL HIGH (ref 3.87–5.11)
RDW: 13.8 % (ref 11.5–15.5)
WBC: 13.4 10*3/uL — AB (ref 4.0–10.5)

## 2015-06-27 NOTE — Progress Notes (Signed)
Pre visit review using our clinic review tool, if applicable. No additional management support is needed unless otherwise documented below in the visit note. 

## 2015-06-27 NOTE — Progress Notes (Signed)
OFFICE VISIT  06/27/2015   CC:  Chief Complaint  Patient presents with  . Abdominal Pain    ULQ x 2-3 weeks  . URI    x 1 day  . Back Pain    x 2-3 weeks   HPI:    Patient is a 35 y.o. Caucasian female who presents for pain in right upper quadrant of abdomen with a feeling of significant abd distention for the last 3 wks.  This is an INTERMITTENT problem. Getting up and moving around makes it hurt, feels like stomach distends.  The pain is stabbing, no tingling/burning.  Seems to consistently go away if she sits down and especially if she is able to lie supine. Eating does not make it worse as long as she is sitting and doing nothing.   No nausea.  No trauma to the area.  At onset, she could feel a knot in the area of pain, says it has gone down some since then.  Her back hurts in R glut and back of thigh area.   Says entire back hurts when questioned further.   Appetite is still good.  Not having BMs quite a often and the stool comes in smaller pieces. No recent new meds.  No fevers.  No bloody BMs.  Past Medical History  Diagnosis Date  . Depression     Situational  . Migraine     Quiescent since her mid 54s  . Eclampsia     Seizure 1 wk after delivery of 3rd child.  . Degenerative disc disease   . Cervical spondylosis   . Lumbar spondylosis   . Syrinx of spinal cord (HCC)     Small thoracic syrinx--"stable over multiple years with multiple MRIs", "no need to worry about any further" per NS, Dr. Lovell Sheehan.  Marland Kitchen NSVD (normal spontaneous vaginal delivery)     x 3  . History of chronic sinusitis 2012  . History of vitamin D deficiency     Pt says she took replacement dosing and then daily maintenance but no vit D level was rechecked    Past Surgical History  Procedure Laterality Date  . Dilation and evacuation  1999    Outpatient Prescriptions Prior to Visit  Medication Sig Dispense Refill  . citalopram (CELEXA) 40 MG tablet Take 1 tablet (40 mg total) by mouth daily. 30  tablet 7  . fluticasone (FLONASE) 50 MCG/ACT nasal spray Place 2 sprays into both nostrils daily. 16 g 12  . levonorgestrel (MIRENA) 20 MCG/24HR IUD 1 each by Intrauterine route once.    . metaxalone (SKELAXIN) 800 MG tablet Take 1 tablet (800 mg total) by mouth 4 (four) times daily as needed. 120 tablet 5  . benzonatate (TESSALON) 200 MG capsule Take 1 capsule (200 mg total) by mouth 2 (two) times daily as needed for cough. (Patient not taking: Reported on 06/27/2015) 30 capsule 1  . doxycycline (VIBRA-TABS) 100 MG tablet Take 1 tablet (100 mg total) by mouth 2 (two) times daily. (Patient not taking: Reported on 06/27/2015) 20 tablet 0  . HYDROcodone-acetaminophen (NORCO) 10-325 MG per tablet Take 1-2 tablets by mouth every 6 (six) hours as needed. (Patient not taking: Reported on 06/27/2015) 40 tablet 0  . HYDROcodone-homatropine (HYCODAN) 5-1.5 MG/5ML syrup 1-2 tsp po qhs prn cough (Patient not taking: Reported on 06/27/2015) 120 mL 0  . predniSONE (DELTASONE) 20 MG tablet 1 tab by month daily x 5 days (Patient not taking: Reported on 06/27/2015) 5 tablet 0  No facility-administered medications prior to visit.    Allergies  Allergen Reactions  . Penicillins Rash    ROS As per HPI  PE: Blood pressure 130/84, pulse 97, temperature 98 F (36.7 C), temperature source Oral, resp. rate 16, height  (1.702 m), weight 243 lb 8 oz (110.451 kg), SpO2 98 %.  Pt examined with Wallace Keller, CMA, as chaperone.  Gen: Alert, well appearing.  Patient is oriented to person, place, time, and situation. ZOX:WRUE: no injection, icteris, swelling, or exudate.  EOMI, PERRLA. Mouth: lips without lesion/swelling.  Oral mucosa pink and moist. Oropharynx without erythema, exudate, or swelling.  CV: RRR, no m/r/g.   LUNGS: CTA bilat, nonlabored resps, good aeration in all lung fields. ABD: rotund but no distention.  Mildly tender to palpation over entire RUQ and over lower 1-2 ribs anterolaterally on right.   No guarding or rebound.  BS normal.  No organomegaly or bruit.   She has faint suggestion of abd wall squishy/fatty mass in RUQ.  This is more palpable when pt stands up.  LABS:  None today  IMPRESSION AND PLAN:  RUQ pain and abd distention-- associated with being in upright position and moving around.  Resolves when she sits/lies supine. This sounds like intermittent bowel obstruction BUT I don't know what process is causing this, because she describes normal appetite and no abnormal response to eating.  In addition, she has never had an abdominal surgery.  I don't know of a typical hernia in this region that would cause this. Will check CBC w/diff, CMET, KUB, and start with abd ultrasound. May ultimately have to get abd/pelv CT or surgical consult.  An After Visit Summary was printed and given to the patient.  FOLLOW UP: Return for f/u to be determined depending on results of work up.

## 2015-06-28 ENCOUNTER — Ambulatory Visit (HOSPITAL_BASED_OUTPATIENT_CLINIC_OR_DEPARTMENT_OTHER)
Admission: RE | Admit: 2015-06-28 | Discharge: 2015-06-28 | Disposition: A | Discharge status: Home / Self Care | Payer: PRIVATE HEALTH INSURANCE | Source: Ambulatory Visit | Attending: Family Medicine | Admitting: Family Medicine

## 2015-06-28 DIAGNOSIS — R1011 Right upper quadrant pain: Secondary | ICD-10-CM | POA: Insufficient documentation

## 2015-06-28 DIAGNOSIS — K8020 Calculus of gallbladder without cholecystitis without obstruction: Secondary | ICD-10-CM | POA: Insufficient documentation

## 2015-06-29 ENCOUNTER — Telehealth: Payer: Self-pay | Admitting: *Deleted

## 2015-06-29 ENCOUNTER — Ambulatory Visit: Payer: Self-pay | Admitting: Surgery

## 2015-06-29 NOTE — Telephone Encounter (Signed)
Per Dr. Milinda Cave please call pt and see if she went to ER as directed by on call provider due to blocked bile duct shown on ultrasound (done on 06/28/15).   Pt did not answer left message for her to call back to get results of her ultrasound.

## 2015-06-29 NOTE — Telephone Encounter (Signed)
Pt returned call and spoke to Dr. Milinda Cave about her ultrasound results. She is scheduled to see general surgery today.

## 2015-06-29 NOTE — H&P (Signed)
History of Present Illness Jenna Taylor. Eirik Schueler MD; 06/29/2015 11:58 AM) The patient is a 35 year old female who presents for evaluation of gall stones. Referred by Dr. Nicoletta Ba for symptomatic gallstones  This is a 35 year old female who presents with several weeks of intermittent severe right upper quadrant abdominal pain. This is associated with mild nausea, abdominal bloating, and some slight constipation. The patient has chronic back pain due to spinal issues as she cannot tell if there is any additional back pain. She underwent a workup including blood work that showed normal liver function tests. However an ultrasound showed gallstones with a small gallstone stuck in the neck of the gallbladder. She presents now to discuss surgery. She does not really relate any correlation to eating but has had a decreased appetite.  CLINICAL DATA: Right upper quadrant pain and abdominal bloating  EXAM: ABDOMEN ULTRASOUND COMPLETE  COMPARISON: Ultrasound abdomen of 08/30/2005  FINDINGS: Gallbladder: The gallbladder is visualized and gallstones are present within the gallbladder, the largest measuring 1.2 cm in diameter. One calculus of approximately 8 mm does remain lodged in the region of the neck of the gallbladder. According to the technologist, there is pain over the gallbladder with compression, and acute cholecystitis is therefore a definite consideration.  Common bile duct: Diameter: The best measurement of the common bile duct is 5 mm with bowel gas obscuring detail. The liver is diffusely echogenic consistent with fatty infiltration. No focal hepatic abnormality is seen.  Liver: No focal lesion identified. Within normal limits in parenchymal echogenicity.  IVC: The IVC is obscured by bowel gas.  Pancreas: The pancreas completely obscured by bowel gas and cannot be evaluated.  Spleen: The spleen is normal measuring 5.1 cm.  Right Kidney: Length: 11.6 cm. No  hydronephrosis is seen.  Left Kidney: Length: 11.3 cm. No hydronephrosis is noted.  Abdominal aorta: The abdominal aorta is partially obscured by bowel gas. No obvious aneurysm is seen.  Other findings: None.  IMPRESSION: 1. Gallstones with one gallstone appearing to be lodged in the neck of the gallbladder. There is pain over the gallbladder with compression and therefore acute cholecystitis is a definite consideration. 2. The pancreas and abdominal aorta are largely obscured by bowel gas.   Electronically Signed By: Dwyane Dee M.D. On: 06/28/2015 17:04  WBC 13.4 Hgb 15 LFT's WNL   Other Problems (Sonya Bynum, CMA; 06/29/2015 11:34 AM) Anxiety Disorder Back Pain Gastroesophageal Reflux Disease Hemorrhoids High blood pressure Migraine Headache Seizure Disorder  Past Surgical History Gilmer Mor, CMA; 06/29/2015 11:34 AM) Oral Surgery  Diagnostic Studies History Gilmer Mor, CMA; 06/29/2015 11:34 AM) Colonoscopy never Mammogram never Pap Smear 1-5 years ago  Allergies Lamar Laundry Bynum, CMA; 06/29/2015 11:35 AM) PenicillAMINE *Assorted Classes**  Medication History (Sonya Bynum, CMA; 06/29/2015 11:35 AM) Skelaxin (  Tablet, Oral) Active. Vicodin (5-300MG  Tablet, Oral) Active. CeleXA (  Tablet, Oral) Active. Medications Reconciled  Social History Gilmer Mor, CMA; 06/29/2015 11:34 AM) Alcohol use Occasional alcohol use. Caffeine use Carbonated beverages, Coffee, Tea. Illicit drug use Remotely quit drug use. Tobacco use Current every day smoker.  Family History Gilmer Mor, CMA; 06/29/2015 11:34 AM) Alcohol Abuse Family Members In General. Arthritis Family Members In General. Cancer Family Members In General. Cervical Cancer Family Members In General. Colon Cancer Family Members In General. Depression Father, Mother. Heart Disease Family Members In General. Kidney Disease Family Members In General. Respiratory Condition  Father. Thyroid problems Mother.  Pregnancy / Birth History Gilmer Mor, CMA; 06/29/2015 11:34 AM) Age at menarche 48  years. Contraceptive History Intrauterine device. Gravida 4 Irregular periods Maternal age 83-20 Para 3     Review of Systems Lamar Laundry Bynum CMA; 06/29/2015 11:34 AM) General Present- Fatigue and Weight Gain. Not Present- Appetite Loss, Chills, Fever, Night Sweats and Weight Loss. Skin Not Present- Change in Wart/Mole, Dryness, Hives, Jaundice, New Lesions, Non-Healing Wounds, Rash and Ulcer. HEENT Present- Seasonal Allergies, Sinus Pain, Sore Throat and Wears glasses/contact lenses. Not Present- Earache, Hearing Loss, Hoarseness, Nose Bleed, Oral Ulcers, Ringing in the Ears, Visual Disturbances and Yellow Eyes. Respiratory Present- Snoring. Not Present- Bloody sputum, Chronic Cough, Difficulty Breathing and Wheezing. Breast Not Present- Breast Mass, Breast Pain, Nipple Discharge and Skin Changes. Cardiovascular Not Present- Chest Pain, Difficulty Breathing Lying Down, Leg Cramps, Palpitations, Rapid Heart Rate, Shortness of Breath and Swelling of Extremities. Gastrointestinal Present- Abdominal Pain, Bloating, Change in Bowel Habits, Excessive gas, Hemorrhoids and Indigestion. Not Present- Bloody Stool, Chronic diarrhea, Constipation, Difficulty Swallowing, Gets full quickly at meals, Nausea, Rectal Pain and Vomiting. Female Genitourinary Not Present- Frequency, Nocturia, Painful Urination, Pelvic Pain and Urgency. Musculoskeletal Present- Back Pain and Joint Stiffness. Not Present- Joint Pain, Muscle Pain, Muscle Weakness and Swelling of Extremities. Neurological Present- Headaches, Numbness, Tingling and Trouble walking. Not Present- Decreased Memory, Fainting, Seizures, Tremor and Weakness. Psychiatric Present- Anxiety. Not Present- Bipolar, Change in Sleep Pattern, Depression, Fearful and Frequent crying. Endocrine Not Present- Cold Intolerance, Excessive Hunger,  Hair Changes, Heat Intolerance, Hot flashes and New Diabetes. Hematology Not Present- Easy Bruising, Excessive bleeding, Gland problems, HIV and Persistent Infections.  Vitals (Sonya Bynum CMA; 06/29/2015 11:34 AM) 06/29/2015 11:34 AM Weight: 243 lb Height: 67in Body Surface Area: 2.2 m Body Mass Index: 38.06 kg/m  Temp.: 97.52F(Temporal)  Pulse: 81 (Regular)  BP: 126/72 (Sitting, Left Arm, Standard)      Physical Exam Molli Hazard K. Philbert Ocallaghan MD; 06/29/2015 11:58 AM)  The physical exam findings are as follows: Note:WDWN in NAD HEENT: EOMI, sclera anicteric Neck: No masses, no thyromegaly Lungs: CTA bilaterally; normal respiratory effort CV: Regular rate and rhythm; no murmurs Abd: +bowel sounds, soft, tender in RUQ; no palpable masses Ext: Well-perfused; no edema Skin: Warm, dry; no sign of jaundice    Assessment & Plan Molli Hazard K. Davionne Mastrangelo MD; 06/29/2015 11:52 AM)  CHRONIC CHOLECYSTITIS WITH CALCULUS (K80.10)  Current Plans Schedule for Surgery - Laparoscopic cholecystectomy with intraoperative cholangiogram. The surgical procedure has been discussed with the patient. Potential risks, benefits, alternative treatments, and expected outcomes have been explained. All of the patient's questions at this time have been answered. The likelihood of reaching the patient's treatment goal is good. The patient understand the proposed surgical procedure and wishes to proceed. Started Percocet 7.5-325MG , 1 (one) Tablet every four hours, as needed, #40, 06/29/2015, No Refill. Started Zofran ODT , 1 (one) Tablet Disperse every six hours, as needed, #20, 06/29/2015, No Refill.  Jenna Taylor. Corliss Skains, MD, [facility] Surgery  General/ Trauma Surgery  06/29/2015 11:58 AM

## 2015-07-03 ENCOUNTER — Encounter (HOSPITAL_COMMUNITY)
Admission: RE | Admit: 2015-07-03 | Discharge: 2015-07-03 | Disposition: A | Discharge status: Home / Self Care | Payer: PRIVATE HEALTH INSURANCE | Source: Ambulatory Visit | Attending: Surgery | Admitting: Surgery

## 2015-07-03 ENCOUNTER — Encounter (HOSPITAL_COMMUNITY): Payer: Self-pay

## 2015-07-03 DIAGNOSIS — Z79899 Other long term (current) drug therapy: Secondary | ICD-10-CM | POA: Diagnosis not present

## 2015-07-03 DIAGNOSIS — K8010 Calculus of gallbladder with chronic cholecystitis without obstruction: Secondary | ICD-10-CM | POA: Diagnosis present

## 2015-07-03 DIAGNOSIS — F329 Major depressive disorder, single episode, unspecified: Secondary | ICD-10-CM | POA: Diagnosis not present

## 2015-07-03 DIAGNOSIS — F17200 Nicotine dependence, unspecified, uncomplicated: Secondary | ICD-10-CM | POA: Diagnosis not present

## 2015-07-03 DIAGNOSIS — F419 Anxiety disorder, unspecified: Secondary | ICD-10-CM | POA: Diagnosis not present

## 2015-07-03 DIAGNOSIS — Z793 Long term (current) use of hormonal contraceptives: Secondary | ICD-10-CM | POA: Diagnosis not present

## 2015-07-03 HISTORY — DX: Unspecified hemorrhoids: K64.9

## 2015-07-03 HISTORY — DX: Anxiety disorder, unspecified: F41.9

## 2015-07-03 HISTORY — DX: Gastro-esophageal reflux disease without esophagitis: K21.9

## 2015-07-03 HISTORY — DX: Other cervical disc displacement, unspecified cervical region: M50.20

## 2015-07-03 LAB — CBC
HCT: 44.9 % (ref 36.0–46.0)
Hemoglobin: 14.7 g/dL (ref 12.0–15.0)
MCH: 28.2 pg (ref 26.0–34.0)
MCHC: 32.7 g/dL (ref 30.0–36.0)
MCV: 86.2 fL (ref 78.0–100.0)
PLATELETS: 253 10*3/uL (ref 150–400)
RBC: 5.21 MIL/uL — ABNORMAL HIGH (ref 3.87–5.11)
RDW: 13.5 % (ref 11.5–15.5)
WBC: 12.2 10*3/uL — AB (ref 4.0–10.5)

## 2015-07-03 LAB — HCG, SERUM, QUALITATIVE: Preg, Serum: NEGATIVE

## 2015-07-03 NOTE — Progress Notes (Signed)
Pt denies SOB, chest pain, and being under the care of a cardiologist. Pt denies having a stress test, echo and cardiac cath. Pt denies having a chest x ray and EKG within the last year. 

## 2015-07-03 NOTE — Pre-Procedure Instructions (Signed)
Jenna Taylor  07/03/2015      [facility] [address], [place] - [address] [place] [address] Phone: [phone] Fax: [phone]    Your procedure is scheduled on Thursday, July 05, 2015  Report to [facility] Admitting at 5:30 A.M.  Call this number if you have problems the morning of surgery:  [phone]   Remember:  Do not eat food or drink liquids after midnight Wednesday, July 04, 2015  Take these medicines the morning of surgery with A SIP OF WATER : citalopram (CELEXA), fluticasone (FLONASE)  nasal spray, if needed: acetaminophen (TYLENOL) for mild pain or headache OR  oxyCODONE-acetaminophen (PERCOCET) for moderate pain or severe pain.  Stop taking Aspirin, vitamins, fish oil and herbal medications. Do not take any NSAIDs ie: Ibuprofen, Advil, Naproxen or any medication containing Aspirin; stop now.  Do not wear jewelry, make-up or nail polish.  Do not wear lotions, powders, or perfumes.  You may not wear deodorant.  Do not shave 48 hours prior to surgery.    Do not bring valuables to the hospital.  [facility] is not responsible for any belongings or valuables.  Contacts, dentures or bridgework may not be worn into surgery.  Leave your suitcase in the car.  After surgery it may be brought to your room.  For patients admitted to the hospital, discharge time will be determined by your treatment team.  Patients discharged the day of surgery will not be allowed to drive home.   Name and phone number of your driver:   Special instructions:  [place] - Preparing for Surgery  Before surgery, you can play an important role.  Because skin is not sterile, your skin needs to be as free of germs as possible.  You can reduce the number of germs on you skin by washing with CHG (chlorahexidine gluconate) soap before surgery.  CHG is an antiseptic cleaner which kills germs and bonds with the skin to continue killing germs even  after washing.  Please DO NOT use if you have an allergy to CHG or antibacterial soaps.  If your skin becomes reddened/irritated stop using the CHG and inform your nurse when you arrive at Short Stay.  Do not shave (including legs and underarms) for at least 48 hours prior to the first CHG shower.  You may shave your face.  Please follow these instructions carefully:   1.  Shower with CHG Soap the night before surgery and the morning of Surgery.  2.  If you choose to wash your hair, wash your hair first as usual with your normal shampoo.  3.  After you shampoo, rinse your hair and body thoroughly to remove the Shampoo.  4.  Use CHG as you would any other liquid soap.  You can apply chg directly  to the skin and wash gently with scrungie or a clean washcloth.  5.  Apply the CHG Soap to your body ONLY FROM THE NECK DOWN.  Do not use on open wounds or open sores.  Avoid contact with your eyes, ears, mouth and genitals (private parts).  Wash genitals (private parts) with your normal soap.  6.  Wash thoroughly, paying special attention to the area where your surgery will be performed.  7.  Thoroughly rinse your body with warm water from the neck down.  8.  DO NOT shower/wash with your normal soap after using and rinsing off the CHG Soap.  9.  Pat yourself dry with a clean towel.            10.  Wear clean pajamas.            11.  Place clean sheets on your bed the night of your first shower and do not sleep with pets.  Day of Surgery  Do not apply any lotions/deodorants the morning of surgery.  Please wear clean clothes to the hospital/surgery center.  Please read over the following fact sheets that you were given. Pain Booklet, Coughing and Deep Breathing and Surgical Site Infection Prevention

## 2015-07-04 MED ORDER — CHLORHEXIDINE GLUCONATE 4 % EX LIQD: 1.0000 "application " | Freq: Once | CUTANEOUS | Status: DC

## 2015-07-04 MED ORDER — CIPROFLOXACIN IN D5W 400 MG/200ML IV SOLN
400.0000 mg | INTRAVENOUS | Status: AC
  Administered 2015-07-05: 400 mg via INTRAVENOUS
  Filled 2015-07-04: qty 200

## 2015-07-04 NOTE — Anesthesia Preprocedure Evaluation (Addendum)
Anesthesia Evaluation  Patient identified by MRN, date of birth, ID band Patient awake    Reviewed: Allergy & Precautions, H&P , NPO status , Patient's Chart, lab work & pertinent test results  History of Anesthesia Complications Negative for: history of anesthetic complications  Airway Mallampati: II  TM Distance: >3 FB Neck ROM: full    Dental no notable dental hx. (+) Teeth Intact, Dental Advisory Given   Pulmonary neg pulmonary ROS, Current Smoker,    Pulmonary exam normal breath sounds clear to auscultation       Cardiovascular Exercise Tolerance: Good negative cardio ROS Normal cardiovascular exam Rhythm:regular Rate:Normal     Neuro/Psych Anxiety Depression negative neurological ROS  negative psych ROS   GI/Hepatic negative GI ROS, Neg liver ROS, GERD  ,  Endo/Other  negative endocrine ROS  Renal/GU negative Renal ROS  negative genitourinary   Musculoskeletal   Abdominal   Peds  Hematology negative hematology ROS (+)   Anesthesia Other Findings   Reproductive/Obstetrics negative OB ROS                            Anesthesia Physical Anesthesia Plan  ASA: II  Anesthesia Plan: General   Post-op Pain Management:    Induction: Intravenous  Airway Management Planned: Oral ETT  Additional Equipment:   Intra-op Plan:   Post-operative Plan: Extubation in OR  Informed Consent: I have reviewed the patients History and Physical, chart, labs and discussed the procedure including the risks, benefits and alternatives for the proposed anesthesia with the patient or authorized representative who has indicated his/her understanding and acceptance.   Dental Advisory Given  Plan Discussed with: CRNA and Surgeon  Anesthesia Plan Comments:         Anesthesia Quick Evaluation

## 2015-07-05 ENCOUNTER — Ambulatory Visit (HOSPITAL_COMMUNITY): Payer: PRIVATE HEALTH INSURANCE

## 2015-07-05 ENCOUNTER — Ambulatory Visit (HOSPITAL_COMMUNITY): Payer: PRIVATE HEALTH INSURANCE | Admitting: Anesthesiology

## 2015-07-05 ENCOUNTER — Encounter (HOSPITAL_COMMUNITY)
Admission: RE | Disposition: A | Discharge status: Home / Self Care | Payer: Self-pay | Source: Ambulatory Visit | Attending: Surgery

## 2015-07-05 ENCOUNTER — Encounter (HOSPITAL_COMMUNITY): Payer: Self-pay | Admitting: Surgery

## 2015-07-05 ENCOUNTER — Ambulatory Visit (HOSPITAL_COMMUNITY)
Admission: RE | Admit: 2015-07-05 | Discharge: 2015-07-05 | Disposition: A | Discharge status: Home / Self Care | Payer: PRIVATE HEALTH INSURANCE | Source: Ambulatory Visit | Attending: Surgery | Admitting: Surgery

## 2015-07-05 DIAGNOSIS — Z419 Encounter for procedure for purposes other than remedying health state, unspecified: Secondary | ICD-10-CM

## 2015-07-05 DIAGNOSIS — F329 Major depressive disorder, single episode, unspecified: Secondary | ICD-10-CM | POA: Insufficient documentation

## 2015-07-05 DIAGNOSIS — K8010 Calculus of gallbladder with chronic cholecystitis without obstruction: Secondary | ICD-10-CM | POA: Diagnosis not present

## 2015-07-05 DIAGNOSIS — Z79899 Other long term (current) drug therapy: Secondary | ICD-10-CM | POA: Insufficient documentation

## 2015-07-05 DIAGNOSIS — F17200 Nicotine dependence, unspecified, uncomplicated: Secondary | ICD-10-CM | POA: Insufficient documentation

## 2015-07-05 DIAGNOSIS — F419 Anxiety disorder, unspecified: Secondary | ICD-10-CM | POA: Insufficient documentation

## 2015-07-05 DIAGNOSIS — Z793 Long term (current) use of hormonal contraceptives: Secondary | ICD-10-CM | POA: Insufficient documentation

## 2015-07-05 HISTORY — PX: CHOLECYSTECTOMY: SHX55

## 2015-07-05 SURGERY — LAPAROSCOPIC CHOLECYSTECTOMY WITH INTRAOPERATIVE CHOLANGIOGRAM
Anesthesia: General

## 2015-07-05 MED ORDER — SODIUM CHLORIDE 0.9 % IV SOLN
INTRAVENOUS | Status: DC | PRN
  Administered 2015-07-05: 8 mL

## 2015-07-05 MED ORDER — LACTATED RINGERS IV SOLN: INTRAVENOUS | Status: DC

## 2015-07-05 MED ORDER — LIDOCAINE HCL (CARDIAC) 20 MG/ML IV SOLN
INTRAVENOUS | Status: DC | PRN
Start: 2015-07-05 — End: 2015-07-05
  Administered 2015-07-05: 90 mg via INTRAVENOUS

## 2015-07-05 MED ORDER — NEOSTIGMINE METHYLSULFATE 10 MG/10ML IV SOLN
INTRAVENOUS | Status: DC | PRN
  Administered 2015-07-05: 5 mg via INTRAVENOUS

## 2015-07-05 MED ORDER — ONDANSETRON HCL 4 MG/2ML IJ SOLN
INTRAMUSCULAR | Status: DC | PRN
  Administered 2015-07-05: 4 mg via INTRAVENOUS

## 2015-07-05 MED ORDER — BUPIVACAINE-EPINEPHRINE 0.25% -1:200000 IJ SOLN
INTRAMUSCULAR | Status: DC | PRN
  Administered 2015-07-05: 10 mL

## 2015-07-05 MED ORDER — PROPOFOL 10 MG/ML IV BOLUS
INTRAVENOUS | Status: DC | PRN
  Administered 2015-07-05: 200 mg via INTRAVENOUS

## 2015-07-05 MED ORDER — MORPHINE SULFATE (PF) 2 MG/ML IV SOLN: 2.0000 mg | INTRAVENOUS | Status: DC | PRN

## 2015-07-05 MED ORDER — SODIUM CHLORIDE 0.9 % IR SOLN
Status: DC | PRN
  Administered 2015-07-05: 1

## 2015-07-05 MED ORDER — LACTATED RINGERS IV SOLN
INTRAVENOUS | Status: DC | PRN
  Administered 2015-07-05: 07:00:00 via INTRAVENOUS

## 2015-07-05 MED ORDER — BUPIVACAINE-EPINEPHRINE (PF) 0.25% -1:200000 IJ SOLN
INTRAMUSCULAR | Status: AC
  Filled 2015-07-05: qty 30

## 2015-07-05 MED ORDER — FENTANYL CITRATE (PF) 100 MCG/2ML IJ SOLN
INTRAMUSCULAR | Status: DC | PRN
  Administered 2015-07-05: 50 ug via INTRAVENOUS
  Administered 2015-07-05: 100 ug via INTRAVENOUS
  Administered 2015-07-05 (×2): 50 ug via INTRAVENOUS

## 2015-07-05 MED ORDER — DEXAMETHASONE SODIUM PHOSPHATE 4 MG/ML IJ SOLN
INTRAMUSCULAR | Status: DC | PRN
  Administered 2015-07-05: 4 mg via INTRAVENOUS

## 2015-07-05 MED ORDER — PROPOFOL 10 MG/ML IV BOLUS
INTRAVENOUS | Status: AC
  Filled 2015-07-05: qty 20

## 2015-07-05 MED ORDER — HYDROMORPHONE HCL 1 MG/ML IJ SOLN
INTRAMUSCULAR | Status: AC
  Administered 2015-07-05: 0.5 mg via INTRAVENOUS
  Filled 2015-07-05: qty 1

## 2015-07-05 MED ORDER — MIDAZOLAM HCL 2 MG/2ML IJ SOLN
INTRAMUSCULAR | Status: AC
  Filled 2015-07-05: qty 2

## 2015-07-05 MED ORDER — MIDAZOLAM HCL 5 MG/5ML IJ SOLN
INTRAMUSCULAR | Status: DC | PRN
  Administered 2015-07-05: 2 mg via INTRAVENOUS

## 2015-07-05 MED ORDER — OXYCODONE-ACETAMINOPHEN 5-325 MG PO TABS: 1.0000 | ORAL_TABLET | ORAL | Status: DC | PRN

## 2015-07-05 MED ORDER — FENTANYL CITRATE (PF) 250 MCG/5ML IJ SOLN
INTRAMUSCULAR | Status: AC
  Filled 2015-07-05: qty 5

## 2015-07-05 MED ORDER — ROCURONIUM BROMIDE 100 MG/10ML IV SOLN
INTRAVENOUS | Status: DC | PRN
  Administered 2015-07-05: 10 mg via INTRAVENOUS
  Administered 2015-07-05: 50 mg via INTRAVENOUS

## 2015-07-05 MED ORDER — GLYCOPYRROLATE 0.2 MG/ML IJ SOLN
INTRAMUSCULAR | Status: DC | PRN
  Administered 2015-07-05: .8 mg via INTRAVENOUS

## 2015-07-05 MED ORDER — HYDROMORPHONE HCL 1 MG/ML IJ SOLN
0.2500 mg | INTRAMUSCULAR | Status: DC | PRN
  Administered 2015-07-05 (×4): 0.5 mg via INTRAVENOUS

## 2015-07-05 MED ORDER — ONDANSETRON HCL 4 MG/2ML IJ SOLN
4.0000 mg | INTRAMUSCULAR | Status: DC | PRN
  Filled 2015-07-05: qty 2

## 2015-07-05 SURGICAL SUPPLY — 44 items
APL SKNCLS STERI-STRIP NONHPOA (GAUZE/BANDAGES/DRESSINGS) ×1
APPLIER CLIP ROT 10 11.4 M/L (STAPLE) ×2
APR CLP MED LRG 11.4X10 (STAPLE) ×1
BAG SPEC RTRVL LRG 6X4 10 (ENDOMECHANICALS) ×1
BENZOIN TINCTURE PRP APPL 2/3 (GAUZE/BANDAGES/DRESSINGS) ×2 IMPLANT
BLADE SURG ROTATE 9660 (MISCELLANEOUS) IMPLANT
CANISTER SUCTION 2500CC (MISCELLANEOUS) ×2 IMPLANT
CHLORAPREP W/TINT 26ML (MISCELLANEOUS) ×2 IMPLANT
CLIP APPLIE ROT 10 11.4 M/L (STAPLE) ×1 IMPLANT
COVER MAYO STAND STRL (DRAPES) ×2 IMPLANT
COVER SURGICAL LIGHT HANDLE (MISCELLANEOUS) ×2 IMPLANT
DRAPE C-ARM 42X72 X-RAY (DRAPES) ×2 IMPLANT
DRSG TEGADERM 2-3/8X2-3/4 SM (GAUZE/BANDAGES/DRESSINGS) ×4 IMPLANT
DRSG TEGADERM 4X4.75 (GAUZE/BANDAGES/DRESSINGS) ×2 IMPLANT
ELECT REM PT RETURN 9FT ADLT (ELECTROSURGICAL) ×2
ELECTRODE REM PT RTRN 9FT ADLT (ELECTROSURGICAL) ×1 IMPLANT
FILTER SMOKE EVAC LAPAROSHD (FILTER) ×2 IMPLANT
GAUZE SPONGE 2X2 8PLY STRL LF (GAUZE/BANDAGES/DRESSINGS) ×1 IMPLANT
GLOVE BIO SURGEON STRL SZ7 (GLOVE) ×2 IMPLANT
GLOVE BIOGEL PI IND STRL 7.5 (GLOVE) ×1 IMPLANT
GLOVE BIOGEL PI INDICATOR 7.5 (GLOVE) ×1
GOWN STRL REUS W/ TWL LRG LVL3 (GOWN DISPOSABLE) ×3 IMPLANT
GOWN STRL REUS W/TWL LRG LVL3 (GOWN DISPOSABLE) ×6
HEMOSTAT SNOW SURGICEL 2X4 (HEMOSTASIS) ×1 IMPLANT
KIT BASIN OR (CUSTOM PROCEDURE TRAY) ×2 IMPLANT
KIT ROOM TURNOVER OR (KITS) ×2 IMPLANT
NS IRRIG 1000ML POUR BTL (IV SOLUTION) ×2 IMPLANT
PAD ARMBOARD 7.5X6 YLW CONV (MISCELLANEOUS) ×2 IMPLANT
POUCH SPECIMEN RETRIEVAL 10MM (ENDOMECHANICALS) ×2 IMPLANT
SCISSORS LAP 5X35 DISP (ENDOMECHANICALS) ×2 IMPLANT
SET CHOLANGIOGRAPH 5 50 .035 (SET/KITS/TRAYS/PACK) ×2 IMPLANT
SET IRRIG TUBING LAPAROSCOPIC (IRRIGATION / IRRIGATOR) ×2 IMPLANT
SLEEVE ENDOPATH XCEL 5M (ENDOMECHANICALS) ×2 IMPLANT
SPECIMEN JAR SMALL (MISCELLANEOUS) ×2 IMPLANT
SPONGE GAUZE 2X2 STER 10/PKG (GAUZE/BANDAGES/DRESSINGS) ×1
STRIP CLOSURE SKIN 1/2X4 (GAUZE/BANDAGES/DRESSINGS) ×2 IMPLANT
SUT MNCRL AB 4-0 PS2 18 (SUTURE) ×2 IMPLANT
TOWEL OR 17X24 6PK STRL BLUE (TOWEL DISPOSABLE) ×2 IMPLANT
TOWEL OR 17X26 10 PK STRL BLUE (TOWEL DISPOSABLE) ×2 IMPLANT
TRAY LAPAROSCOPIC MC (CUSTOM PROCEDURE TRAY) ×2 IMPLANT
TROCAR XCEL BLUNT TIP 100MML (ENDOMECHANICALS) ×2 IMPLANT
TROCAR XCEL NON-BLD 11X100MML (ENDOMECHANICALS) ×2 IMPLANT
TROCAR XCEL NON-BLD 5MMX100MML (ENDOMECHANICALS) ×2 IMPLANT
TUBING INSUFFLATION (TUBING) ×2 IMPLANT

## 2015-07-05 NOTE — Interval H&P Note (Signed)
History and Physical Interval Note:  07/05/2015 7:22 AM  Jenna Taylor  has presented today for surgery, with the diagnosis of Chronic calculus cholecystitis  The various methods of treatment have been discussed with the patient and family. After consideration of risks, benefits and other options for treatment, the patient has consented to  Procedure(s): LAPAROSCOPIC CHOLECYSTECTOMY WITH INTRAOPERATIVE CHOLANGIOGRAM (N/A) as a surgical intervention .  The patient's history has been reviewed, patient examined, no change in status, stable for surgery.  I have reviewed the patient's chart and labs.  Questions were answered to the patient's satisfaction.     Jenna Taylor K.

## 2015-07-05 NOTE — H&P (View-Only) (Signed)
History of Present Illness Jenna Taylor. Jenna Criado MD; 06/29/2015 11:58 AM) The patient is a 35 year old female who presents for evaluation of gall stones. Referred by Dr. Nicoletta Ba for symptomatic gallstones  This is a 35 year old female who presents with several weeks of intermittent severe right upper quadrant abdominal pain. This is associated with mild nausea, abdominal bloating, and some slight constipation. The patient has chronic back pain due to spinal issues as she cannot tell if there is any additional back pain. She underwent a workup including blood work that showed normal liver function tests. However an ultrasound showed gallstones with a small gallstone stuck in the neck of the gallbladder. She presents now to discuss surgery. She does not really relate any correlation to eating but has had a decreased appetite.  CLINICAL DATA: Right upper quadrant pain and abdominal bloating  EXAM: ABDOMEN ULTRASOUND COMPLETE  COMPARISON: Ultrasound abdomen of 08/30/2005  FINDINGS: Gallbladder: The gallbladder is visualized and gallstones are present within the gallbladder, the largest measuring 1.2 cm in diameter. One calculus of approximately 8 mm does remain lodged in the region of the neck of the gallbladder. According to the technologist, there is pain over the gallbladder with compression, and acute cholecystitis is therefore a definite consideration.  Common bile duct: Diameter: The best measurement of the common bile duct is 5 mm with bowel gas obscuring detail. The liver is diffusely echogenic consistent with fatty infiltration. No focal hepatic abnormality is seen.  Liver: No focal lesion identified. Within normal limits in parenchymal echogenicity.  IVC: The IVC is obscured by bowel gas.  Pancreas: The pancreas completely obscured by bowel gas and cannot be evaluated.  Spleen: The spleen is normal measuring 5.1 cm.  Right Kidney: Length: 11.6 cm. No  hydronephrosis is seen.  Left Kidney: Length: 11.3 cm. No hydronephrosis is noted.  Abdominal aorta: The abdominal aorta is partially obscured by bowel gas. No obvious aneurysm is seen.  Other findings: None.  IMPRESSION: 1. Gallstones with one gallstone appearing to be lodged in the neck of the gallbladder. There is pain over the gallbladder with compression and therefore acute cholecystitis is a definite consideration. 2. The pancreas and abdominal aorta are largely obscured by bowel gas.   Electronically Signed By: Dwyane Dee M.D. On: 06/28/2015 17:04  WBC 13.4 Hgb 15 LFT's WNL   Other Problems (Sonya Bynum, CMA; 06/29/2015 11:34 AM) Anxiety Disorder Back Pain Gastroesophageal Reflux Disease Hemorrhoids High blood pressure Migraine Headache Seizure Disorder  Past Surgical History Gilmer Mor, CMA; 06/29/2015 11:34 AM) Oral Surgery  Diagnostic Studies History Gilmer Mor, CMA; 06/29/2015 11:34 AM) Colonoscopy never Mammogram never Pap Smear 1-5 years ago  Allergies Lamar Laundry Bynum, CMA; 06/29/2015 11:35 AM) PenicillAMINE *Assorted Classes**  Medication History (Sonya Bynum, CMA; 06/29/2015 11:35 AM) Skelaxin (  Tablet, Oral) Active. Vicodin (5-300MG  Tablet, Oral) Active. CeleXA (  Tablet, Oral) Active. Medications Reconciled  Social History Gilmer Mor, CMA; 06/29/2015 11:34 AM) Alcohol use Occasional alcohol use. Caffeine use Carbonated beverages, Coffee, Tea. Illicit drug use Remotely quit drug use. Tobacco use Current every day smoker.  Family History Gilmer Mor, CMA; 06/29/2015 11:34 AM) Alcohol Abuse Family Members In General. Arthritis Family Members In General. Cancer Family Members In General. Cervical Cancer Family Members In General. Colon Cancer Family Members In General. Depression Father, Mother. Heart Disease Family Members In General. Kidney Disease Family Members In General. Respiratory Condition  Father. Thyroid problems Mother.  Pregnancy / Birth History Gilmer Mor, CMA; 06/29/2015 11:34 AM) Age at menarche 48  years. Contraceptive History Intrauterine device. Gravida 4 Irregular periods Maternal age 15-20 Para 3     Review of Systems (Sonya Bynum CMA; 06/29/2015 11:34 AM) General Present- Fatigue and Weight Gain. Not Present- Appetite Loss, Chills, Fever, Night Sweats and Weight Loss. Skin Not Present- Change in Wart/Mole, Dryness, Hives, Jaundice, New Lesions, Non-Healing Wounds, Rash and Ulcer. HEENT Present- Seasonal Allergies, Sinus Pain, Sore Throat and Wears glasses/contact lenses. Not Present- Earache, Hearing Loss, Hoarseness, Nose Bleed, Oral Ulcers, Ringing in the Ears, Visual Disturbances and Yellow Eyes. Respiratory Present- Snoring. Not Present- Bloody sputum, Chronic Cough, Difficulty Breathing and Wheezing. Breast Not Present- Breast Mass, Breast Pain, Nipple Discharge and Skin Changes. Cardiovascular Not Present- Chest Pain, Difficulty Breathing Lying Down, Leg Cramps, Palpitations, Rapid Heart Rate, Shortness of Breath and Swelling of Extremities. Gastrointestinal Present- Abdominal Pain, Bloating, Change in Bowel Habits, Excessive gas, Hemorrhoids and Indigestion. Not Present- Bloody Stool, Chronic diarrhea, Constipation, Difficulty Swallowing, Gets full quickly at meals, Nausea, Rectal Pain and Vomiting. Female Genitourinary Not Present- Frequency, Nocturia, Painful Urination, Pelvic Pain and Urgency. Musculoskeletal Present- Back Pain and Joint Stiffness. Not Present- Joint Pain, Muscle Pain, Muscle Weakness and Swelling of Extremities. Neurological Present- Headaches, Numbness, Tingling and Trouble walking. Not Present- Decreased Memory, Fainting, Seizures, Tremor and Weakness. Psychiatric Present- Anxiety. Not Present- Bipolar, Change in Sleep Pattern, Depression, Fearful and Frequent crying. Endocrine Not Present- Cold Intolerance, Excessive Hunger,  Hair Changes, Heat Intolerance, Hot flashes and New Diabetes. Hematology Not Present- Easy Bruising, Excessive bleeding, Gland problems, HIV and Persistent Infections.  Vitals (Sonya Bynum CMA; 06/29/2015 11:34 AM) 06/29/2015 11:34 AM Weight: 243 lb Height: 67in Body Surface Area: 2.2 m Body Mass Index: 38.06 kg/m  Temp.: 97.5F(Temporal)  Pulse: 81 (Regular)  BP: 126/72 (Sitting, Left Arm, Standard)      Physical Exam (Ira Busbin K. Nalia Honeycutt MD; 06/29/2015 11:58 AM)  The physical exam findings are as follows: Note:WDWN in NAD HEENT: EOMI, sclera anicteric Neck: No masses, no thyromegaly Lungs: CTA bilaterally; normal respiratory effort CV: Regular rate and rhythm; no murmurs Abd: +bowel sounds, soft, tender in RUQ; no palpable masses Ext: Well-perfused; no edema Skin: Warm, dry; no sign of jaundice    Assessment & Plan (Jaedyn Marrufo K. Alanny Rivers MD; 06/29/2015 11:52 AM)  CHRONIC CHOLECYSTITIS WITH CALCULUS (K80.10)  Current Plans Schedule for Surgery - Laparoscopic cholecystectomy with intraoperative cholangiogram. The surgical procedure has been discussed with the patient. Potential risks, benefits, alternative treatments, and expected outcomes have been explained. All of the patient's questions at this time have been answered. The likelihood of reaching the patient's treatment goal is good. The patient understand the proposed surgical procedure and wishes to proceed. Started Percocet 7.5-325MG, 1 (one) Tablet every four hours, as needed, #40, 06/29/2015, No Refill. Started Zofran ODT 4MG, 1 (one) Tablet Disperse every six hours, as needed, #20, 06/29/2015, No Refill.  Tova Vater K. Kayley Zeiders, MD, FACS [facility]  General/ Trauma Surgery  06/29/2015 11:58 AM  

## 2015-07-05 NOTE — Anesthesia Procedure Notes (Signed)
Procedure Name: Intubation Date/Time: 07/05/2015 8:01 AM Performed by: Virgel Gess LEFFEW Pre-anesthesia Checklist: Patient identified, Patient being monitored, Timeout performed, Emergency Drugs available and Suction available Patient Re-evaluated:Patient Re-evaluated prior to inductionOxygen Delivery Method: Circle System Utilized Preoxygenation: Pre-oxygenation with 100% oxygen Intubation Type: IV induction Ventilation: Mask ventilation without difficulty and Oral airway inserted - appropriate to patient size Laryngoscope Size: Mac and 3 Grade View: Grade II Tube type: Oral Tube size: 7.5 mm Number of attempts: 1 Airway Equipment and Method: Bougie stylet Placement Confirmation: ETT inserted through vocal cords under direct vision,  positive ETCO2 and breath sounds checked- equal and bilateral Secured at: 23 cm Tube secured with: Tape Dental Injury: Teeth and Oropharynx as per pre-operative assessment

## 2015-07-05 NOTE — Discharge Instructions (Signed)
[facility] °LAPAROSCOPIC SURGERY: POST OP INSTRUCTIONS °Always review your discharge instruction sheet given to you by the facility where your surgery was performed. °IF YOU HAVE DISABILITY OR FAMILY LEAVE FORMS, YOU MUST BRING THEM TO THE OFFICE FOR PROCESSING.   °DO NOT GIVE THEM TO YOUR DOCTOR. ° °1. A prescription for pain medication will be given to you upon discharge.  Take your pain medication as prescribed, if needed.  If narcotic pain medicine is not needed, then you may take acetaminophen (Tylenol) or ibuprofen (Advil) as needed. °2. Take your usually prescribed medications unless otherwise directed. °3. If you need a refill on your pain medication, please contact your pharmacy.  They will contact our office to request authorization. Prescriptions will not be filled after 5pm or on week-ends. °4. You should follow a light diet the first few days after arrival home, such as soup and crackers, etc.  Be sure to include lots of fluids daily. °5. Most patients will experience some swelling and bruising in the area of the incisions.  Ice packs will help.  Swelling and bruising can take several days to resolve.  °6. It is common to experience some constipation if taking pain medication after surgery.  Increasing fluid intake and taking a stool softener (such as Colace) will usually help or prevent this problem from occurring.  A mild laxative (Milk of Magnesia or Miralax) should be taken according to package instructions if there are no bowel movements after 48 hours. °7. Unless discharge instructions indicate otherwise, you may remove your bandages 48 hours after surgery, and you may shower at that time.  You will have steri-strips (small skin tapes) in place directly over the incision.  These strips should be left on the skin for 7-10 days.  If your surgeon used skin glue on the incision, you may shower in 24 hours.  The glue will flake off over the next 2-3 weeks.  Any sutures or staples  will be removed at the office during your follow-up visit. °8. ACTIVITIES:  You may resume regular (light) daily activities beginning the next day--such as daily self-care, walking, climbing stairs--gradually increasing activities as tolerated.  You may have sexual intercourse when it is comfortable.  Refrain from any heavy lifting or straining until approved by your doctor. °a. You may drive when you are no longer taking prescription pain medication, you can comfortably wear a seatbelt, and you can safely maneuver your car and apply brakes. °b. RETURN TO WORK:   2-3 weeks °9. You should see your doctor in the office for a follow-up appointment approximately 2-3 weeks after your surgery.  Make sure that you call for this appointment within a day or two after you arrive home to insure a convenient appointment time. °10. OTHER INSTRUCTIONS: ________________________________________________________________________ °WHEN TO CALL YOUR DOCTOR: °1. Fever over 101.0 °2. Inability to urinate °3. Continued bleeding from incision. °4. Increased pain, redness, or drainage from the incision. °5. Increasing abdominal pain ° °The clinic staff is available to answer your questions during regular business hours.  Please don’t hesitate to call and ask to speak to one of the nurses for clinical concerns.  If you have a medical emergency, go to the nearest emergency room or call 911.  A surgeon from [facility] is always on call at the hospital. °[address], [address], [place], [place]  [address] ? [address] [address], [place], [place]   [address] °[phone] ? [phone] ? FAX [phone] °Web site:   [email or web address] ° °

## 2015-07-05 NOTE — Anesthesia Postprocedure Evaluation (Signed)
Anesthesia Post Note  Patient: JENY NIELD  Procedure(s) Performed: Procedure(s) (LRB): LAPAROSCOPIC CHOLECYSTECTOMY WITH INTRAOPERATIVE CHOLANGIOGRAM (N/A)  Patient location during evaluation: PACU Anesthesia Type: General Level of consciousness: awake and alert Pain management: pain level controlled Vital Signs Assessment: post-procedure vital signs reviewed and stable Respiratory status: spontaneous breathing, nonlabored ventilation, respiratory function stable and patient connected to nasal cannula oxygen Cardiovascular status: blood pressure returned to baseline and stable Postop Assessment: no signs of nausea or vomiting Anesthetic complications: no    Last Vitals:  Filed Vitals:   07/05/15 0915 07/05/15 0930  BP: 156/86 150/78  Pulse: 100 94  Temp:    Resp: 21 21    Last Pain:  Filed Vitals:   07/05/15 0943  PainSc: 6                  Jorel Gravlin L

## 2015-07-05 NOTE — Op Note (Signed)
Laparoscopic Cholecystectomy with IOC Procedure Note  Indications: This patient presents with symptomatic gallbladder disease and will undergo laparoscopic cholecystectomy.  Pre-operative Diagnosis: Calculus of gallbladder with other cholecystitis, without mention of obstruction  Post-operative Diagnosis: Same  Surgeon: Copelyn Widmer K.   Assistants: none  Anesthesia: General endotracheal anesthesia  ASA Class: 1  Procedure Details  The patient was seen again in the Holding Room. The risks, benefits, complications, treatment options, and expected outcomes were discussed with the patient. The possibilities of reaction to medication, pulmonary aspiration, perforation of viscus, bleeding, recurrent infection, finding a normal gallbladder, the need for additional procedures, failure to diagnose a condition, the possible need to convert to an open procedure, and creating a complication requiring transfusion or operation were discussed with the patient. The likelihood of improving the patient's symptoms with return to their baseline status is good.  The patient and/or family concurred with the proposed plan, giving informed consent. The site of surgery properly noted. The patient was taken to Operating Room, identified as Jenna Taylor and the procedure verified as Laparoscopic Cholecystectomy with Intraoperative Cholangiogram. A Time Out was held and the above information confirmed.  Prior to the induction of general anesthesia, antibiotic prophylaxis was administered. General endotracheal anesthesia was then administered and tolerated well. After the induction, the abdomen was prepped with Chloraprep and draped in the sterile fashion. The patient was positioned in the supine position.  Local anesthetic agent was injected into the skin above the umbilicus and an incision made. We dissected down to the abdominal fascia with blunt dissection.  The fascia was incised vertically and we entered the  peritoneal cavity bluntly.  A pursestring suture of 0-Vicryl was placed around the fascial opening.  The Hasson cannula was inserted and secured with the stay suture.  Pneumoperitoneum was then created with CO2 and tolerated well without any adverse changes in the patient's vital signs. An 11-mm port was placed in the subxiphoid position.  Two 5-mm ports were placed in the right upper quadrant. All skin incisions were infiltrated with a local anesthetic agent before making the incision and placing the trocars.   We positioned the patient in reverse Trendelenburg, tilted slightly to the patient's left.  The gallbladder was identified, the fundus grasped and retracted cephalad. Adhesions were lysed bluntly and with the electrocautery where indicated, taking care not to injure any adjacent organs or viscus. The infundibulum was grasped and retracted laterally, exposing the peritoneum overlying the triangle of Calot. This was then divided and exposed in a blunt fashion. A critical view of the cystic duct and cystic artery was obtained.  The cystic duct was clearly identified and bluntly dissected circumferentially. The cystic duct was ligated with a clip distally.   An incision was made in the cystic duct and the [facility] cholangiogram catheter introduced. The catheter was secured using a clip. A cholangiogram was then obtained which showed good visualization of the distal and proximal biliary tree with no sign of filling defects or obstruction.  Contrast flowed easily into the duodenum. The catheter was then removed.   The cystic duct was then ligated with clips and divided. The cystic artery was identified, dissected free, ligated with clips and divided as well.  A posterior branch was also ligated and divided.  The gallbladder was dissected from the liver bed in retrograde fashion with the electrocautery. The gallbladder was removed and placed in an Endocatch sac. The liver bed was irrigated and inspected.  Hemostasis was achieved with the electrocautery and Surgicel  SNOW. Copious irrigation was utilized and was repeatedly aspirated until clear.  The gallbladder and Endocatch sac were then removed through the umbilical port site.  The pursestring suture was used to close the umbilical fascia.    We again inspected the right upper quadrant for hemostasis.  Pneumoperitoneum was released as we removed the trocars.  4-0 Monocryl was used to close the skin.   Benzoin, steri-strips, and clean dressings were applied. The patient was then extubated and brought to the recovery room in stable condition. Instrument, sponge, and needle counts were correct at closure and at the conclusion of the case.   Findings: Cholecystitis with Cholelithiasis  Estimated Blood Loss: Minimal         Drains: none         Specimens: Gallbladder           Complications: None; patient tolerated the procedure well.         Disposition: PACU - hemodynamically stable.         Condition: stable  Jenna Taylor. Jenna Skains, MD, [facility]  General/ Trauma Surgery  07/05/2015 9:00 AM

## 2015-07-05 NOTE — Transfer of Care (Signed)
Immediate Anesthesia Transfer of Care Note  Patient: Jenna Taylor  Procedure(s) Performed: Procedure(s): LAPAROSCOPIC CHOLECYSTECTOMY WITH INTRAOPERATIVE CHOLANGIOGRAM (N/A)  Patient Location: PACU  Anesthesia Type:General  Level of Consciousness: awake, alert , patient cooperative and responds to stimulation  Airway & Oxygen Therapy: Patient Spontanous Breathing and Patient connected to nasal cannula oxygen  Post-op Assessment: Report given to RN, Post -op Vital signs reviewed and stable and Patient moving all extremities X 4  Post vital signs: Reviewed and stable  Last Vitals:  Filed Vitals:   07/05/15 0654  BP: 126/79  Pulse: 88  Temp: 36.8 C  Resp: 18    Complications: No apparent anesthesia complications

## 2015-07-06 ENCOUNTER — Encounter (HOSPITAL_COMMUNITY): Payer: Self-pay | Admitting: Surgery

## 2015-07-30 ENCOUNTER — Other Ambulatory Visit: Payer: Self-pay | Admitting: Surgery

## 2015-07-30 ENCOUNTER — Ambulatory Visit
Admission: RE | Admit: 2015-07-30 | Discharge: 2015-07-30 | Disposition: A | Discharge status: Home / Self Care | Payer: PRIVATE HEALTH INSURANCE | Source: Ambulatory Visit | Attending: Surgery | Admitting: Surgery

## 2015-07-30 DIAGNOSIS — R1012 Left upper quadrant pain: Secondary | ICD-10-CM

## 2015-07-30 NOTE — Progress Notes (Signed)
Quick Note:  Please call the patient and let them know that their x-rays just showed constipation. She should use the stool softener and Miralax as we discussed earlier today. ______

## 2015-12-13 ENCOUNTER — Telehealth: Payer: Self-pay | Admitting: Family Medicine

## 2015-12-13 NOTE — Telephone Encounter (Signed)
Patient is requesting an appointment with Dr. Milinda CaveMcGowen but then mentioned she has been having pain above her L breast. She thinks it's stress related but doesn't know if its cardiac related. I offered to transfer patient to [facility] but she declined. Patient then stated that she would be working at [facility] tonight & that she will go to the ER there.

## 2015-12-13 NOTE — Telephone Encounter (Signed)
FYI

## 2015-12-13 NOTE — Telephone Encounter (Signed)
Noted  

## 2016-08-08 ENCOUNTER — Encounter: Payer: Self-pay | Admitting: Family Medicine

## 2016-08-08 ENCOUNTER — Ambulatory Visit (INDEPENDENT_AMBULATORY_CARE_PROVIDER_SITE_OTHER): Payer: No Typology Code available for payment source | Admitting: Family Medicine

## 2016-08-08 ENCOUNTER — Ambulatory Visit (HOSPITAL_BASED_OUTPATIENT_CLINIC_OR_DEPARTMENT_OTHER)
Admission: RE | Admit: 2016-08-08 | Discharge: 2016-08-08 | Disposition: A | Discharge status: Home / Self Care | Payer: No Typology Code available for payment source | Source: Ambulatory Visit | Attending: Family Medicine | Admitting: Family Medicine

## 2016-08-08 VITALS — BP 119/81 | HR 89 | Temp 98.0°F | Resp 16 | Ht 67.0 in | Wt 231.2 lb

## 2016-08-08 DIAGNOSIS — R0781 Pleurodynia: Secondary | ICD-10-CM

## 2016-08-08 DIAGNOSIS — S29011A Strain of muscle and tendon of front wall of thorax, initial encounter: Secondary | ICD-10-CM

## 2016-08-08 MED ORDER — HYDROCODONE-ACETAMINOPHEN 5-325 MG PO TABS: ORAL_TABLET | ORAL | 0 refills | Status: DC

## 2016-08-08 NOTE — Progress Notes (Signed)
OFFICE VISIT  08/08/2016   CC:  Chief Complaint  Patient presents with  . Rib Pain   HPI:    Patient is a 36 y.o.  female who presents for rib pain. Fiance was popping her back 2 nights ago b/c of some back pain, and as her back cracked she then felt a lot of lower rib cage pain bilat up to the area under breasts, left worse than right.   Movement and deep breaths hurt worse.  Pt denies seeing any bruising. Much more sore today than yesterday. No SOB or cough.  She took  ibup this morning and she says it hasn't helped any.  Past Medical History:  Diagnosis Date  . Anxiety   . Cervical spondylosis   . Degenerative disc disease   . Depression    Situational  . Eclampsia    Seizure 1 wk after delivery of 3rd child.  Marland Kitchen GERD (gastroesophageal reflux disease)   . Hemorrhoids   . History of chronic sinusitis 2012  . History of vitamin D deficiency    Pt says she took replacement dosing and then daily maintenance but no vit D level was rechecked  . Hx of gestational diabetes mellitus, not currently pregnant   . Lumbar spondylosis   . Migraine    Quiescent since her mid 11s  . NSVD (normal spontaneous vaginal delivery)    x 3  . Ruptured cervical disc   . 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 Lovell Sheehan.  . Wears glasses     Past Surgical History:  Procedure Laterality Date  . CHOLECYSTECTOMY N/A 07/05/2015   Procedure: LAPAROSCOPIC CHOLECYSTECTOMY WITH INTRAOPERATIVE CHOLANGIOGRAM;  Surgeon: Manus Rudd, MD;  Location: [facility] OR;  Service: General;  Laterality: N/A;  . DILATION AND EVACUATION  1999  . WISDOM TOOTH EXTRACTION      Outpatient Medications Prior to Visit  Medication Sig Dispense Refill  . fluticasone (FLONASE) 50 MCG/ACT nasal spray Place 2 sprays into both nostrils daily. 16 g 12  . levonorgestrel (MIRENA) 20 MCG/24HR IUD 1 each by Intrauterine  route once.    . metaxalone (SKELAXIN) 800 MG tablet Take 1 tablet (800 mg total) by mouth 4 (four) times daily as needed. 120 tablet 5  . citalopram (CELEXA) 40 MG tablet Take 1 tablet (40 mg total) by mouth daily. (Patient not taking: Reported on 08/08/2016) 30 tablet 7  . acetaminophen (TYLENOL) 500 MG tablet Take 1,000 mg by mouth every 6 (six) hours as needed for mild pain or headache.    . oxyCODONE-acetaminophen (PERCOCET) 7.5-325 MG tablet Take 1 tablet by mouth every 4 (four) hours as needed for moderate pain or severe pain.    Marland Kitchen oxyCODONE-acetaminophen (PERCOCET/ROXICET) 5-325 MG tablet Take 1 tablet by mouth every 4 (four) hours as needed for severe pain. (Patient not taking: Reported on 08/08/2016) 40 tablet 0   No facility-administered medications prior to visit.     Allergies  Allergen Reactions  . Penicillins Rash    Has patient had a PCN reaction causing immediate rash, facial/tongue/throat swelling, SOB or lightheadedness with hypotension: unknown, childhood reaction    ROS As per HPI  PE: Blood pressure 119/81, pulse 89, temperature 98 F (36.7 C), temperature source Oral, resp. rate 16, height  (1.702 m), weight 231 lb 4 oz (104.9 kg), SpO2 97 %.  Pt examined with Vanetta Mulders, CMA, as  chaperone.  Gen: Alert, well appearing.  Patient is oriented to person, place, time, and situation. AFFECT: pleasant, lucid thought and speech. She moves slowly from chair to exam table. CV: RRR, no m/r/g.   LUNGS: CTA bilat, nonlabored resps, good aeration in all lung fields. She has tenderness over lower-most intercostal muscles bilat in anterior regions as well as diffuse tenderness over the bottom rib in the same region.  No crepitus.  Mild TTP in xiphoid process area.  LABS:    Chemistry      Component Value Date/Time   NA 141 06/27/2015 1457   K 4.2 06/27/2015 1457   CL 106 06/27/2015 1457   CO2 29 06/27/2015 1457   BUN 5 (L) 06/27/2015 1457   CREATININE 0.73 06/27/2015  1457      Component Value Date/Time   CALCIUM 8.9 06/27/2015 1457   ALKPHOS 66 06/27/2015 1457   AST 14 06/27/2015 1457   ALT 21 06/27/2015 1457   BILITOT 0.4 06/27/2015 1457       IMPRESSION AND PLAN:  Rib cage pain, lower-anterior, bilateral. Suspect intercostal muscle strain but will r/o rib fracture with x-ray today. Recommended ibuprofen 800 mg bid with food x 10d. I gave Vicodin 5/325, 1-2 tabs bid prn severe pain, #30.  An After Visit Summary was printed and given to the patient.  FOLLOW UP: Return if symptoms worsen or fail to improve.  Signed:  Santiago Bumpers, MD           08/08/2016

## 2016-08-08 NOTE — Progress Notes (Signed)
Pre visit review using our clinic review tool, if applicable. No additional management support is needed unless otherwise documented below in the visit note. 

## 2016-08-08 NOTE — Patient Instructions (Signed)
Take 800 mg ibuprofen twice a day with food x 10 days.

## 2016-08-11 ENCOUNTER — Telehealth: Payer: Self-pay | Admitting: Family Medicine

## 2016-08-11 ENCOUNTER — Encounter: Payer: Self-pay | Admitting: *Deleted

## 2016-08-11 NOTE — Telephone Encounter (Signed)
Patient called stating she is scheduled to go back to work today and she is still in a lot of pain and can barely move.  Can she get an extended work excuse?  Please advise.

## 2016-08-11 NOTE — Telephone Encounter (Signed)
Please call pt to triage. Thanks

## 2016-08-11 NOTE — Telephone Encounter (Signed)
Yes, pls write note excusing her from work 4/9, 4/10, and 4/11.  Thx

## 2016-08-11 NOTE — Telephone Encounter (Signed)
Patient did not release she is supposed to go to work 08/14/16. Can she get a note for the 12th as well?

## 2016-08-11 NOTE — Telephone Encounter (Signed)
SW patient regarding symptoms. Patient states her rib pain has not improved, feels that it is getting worse. Rates pain at 8-9 out of 10 on pain scale. She increased Norco to 2 tablets every 4-6 hours with no relief. Encouraged to take Ibuprofen as well as use a heating pad to area. Advised if pain is unbearable, to go to urgent care or Emergency Department, which patient refused. Patient would like other treatment options, scheduled to see PCP on 08/12/16 for evaluation in case pain is not relieved over night. Patient plans to call in the morning to update and determine need for appointment.

## 2016-08-11 NOTE — Telephone Encounter (Signed)
LM requesting call back to discuss symptoms.  

## 2016-08-11 NOTE — Telephone Encounter (Signed)
Letter printed.  Patient aware, she will pick up today.

## 2016-08-11 NOTE — Telephone Encounter (Signed)
Patient states she is starting to swell on L ribcage & pain medications are not helping. Please advise?

## 2016-08-12 ENCOUNTER — Encounter: Payer: Self-pay | Admitting: *Deleted

## 2016-08-12 ENCOUNTER — Ambulatory Visit (INDEPENDENT_AMBULATORY_CARE_PROVIDER_SITE_OTHER): Payer: No Typology Code available for payment source | Admitting: Family Medicine

## 2016-08-12 ENCOUNTER — Encounter: Payer: Self-pay | Admitting: Family Medicine

## 2016-08-12 VITALS — BP 125/89 | HR 84 | Temp 98.0°F | Resp 16 | Ht 67.0 in | Wt 236.0 lb

## 2016-08-12 DIAGNOSIS — R0789 Other chest pain: Secondary | ICD-10-CM

## 2016-08-12 DIAGNOSIS — S29011D Strain of muscle and tendon of front wall of thorax, subsequent encounter: Secondary | ICD-10-CM

## 2016-08-12 MED ORDER — OXYCODONE HCL 5 MG PO TABS: ORAL_TABLET | ORAL | 0 refills | Status: DC

## 2016-08-12 MED ORDER — ONDANSETRON HCL 8 MG PO TABS: 8.0000 mg | ORAL_TABLET | Freq: Three times a day (TID) | ORAL | 0 refills | Status: DC | PRN

## 2016-08-12 NOTE — Telephone Encounter (Signed)
Noted  

## 2016-08-12 NOTE — Patient Instructions (Signed)
Apply heating pad to the area of pain for 20 min twice per day.  Call if you decide you want referral to sports medicine in the next 1 week.

## 2016-08-12 NOTE — Progress Notes (Signed)
OFFICE VISIT  08/12/2016   CC:  Chief Complaint  Patient presents with  . Follow-up    Rib cage pain   HPI:    Patient is a 35 y.o. Caucasian female who presents accompanied by her significant other for f/u of recent diffuse anterior rib pain.  At last visit 08/08/16 she had x-ray negative for rib fractures. I felt like she likely had intercostal strain(s).    Says right side is improved, left side no better.  Nothing is worse.  Deep breath and pretty much any movement of torso makes it worse.  Takes ibuprofen regularly and 2 vicodin 5/325 several times per day and this helps minimally.  Trying heat occasionally and it helps short term.  No SOB.     Past Medical History:  Diagnosis Date  . Anxiety   . Cervical spondylosis   . Degenerative disc disease   . Depression    Situational  . Eclampsia    Seizure 1 wk after delivery of 3rd child.  Marland Kitchen GERD (gastroesophageal reflux disease)   . Hemorrhoids   . History of chronic sinusitis 2012  . History of vitamin D deficiency    Pt says she took replacement dosing and then daily maintenance but no vit D level was rechecked  . Hx of gestational diabetes mellitus, not currently pregnant   . Lumbar spondylosis   . Migraine    Quiescent since her mid 28s  . NSVD (normal spontaneous vaginal delivery)    x 3  . Ruptured cervical disc   . 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 Lovell Sheehan.  . Wears glasses     Past Surgical History:  Procedure Laterality Date  . CHOLECYSTECTOMY N/A 07/05/2015   Procedure: LAPAROSCOPIC CHOLECYSTECTOMY WITH INTRAOPERATIVE CHOLANGIOGRAM;  Surgeon: Manus Rudd, MD;  Location: [facility] OR;  Service: General;  Laterality: N/A;  . DILATION AND EVACUATION  1999  . WISDOM TOOTH EXTRACTION      Outpatient Medications Prior to Visit  Medication Sig Dispense Refill  . fluticasone (FLONASE) 50 MCG/ACT nasal  spray Place 2 sprays into both nostrils daily. 16 g 12  . levonorgestrel (MIRENA) 20 MCG/24HR IUD 1 each by Intrauterine route once.    . metaxalone (SKELAXIN) 800 MG tablet Take 1 tablet (800 mg total) by mouth 4 (four) times daily as needed. 120 tablet 5  . Vitamin D, Ergocalciferol, (DRISDOL) 50000 units CAPS capsule Take 50,000 Units by mouth every 7 (seven) days.    Marland Kitchen HYDROcodone-acetaminophen (NORCO/VICODIN) 5-325 MG tablet 1-2 tabs po bid prn severe pain 30 tablet 0  . citalopram (CELEXA) 40 MG tablet Take 1 tablet (40 mg total) by mouth daily. (Patient not taking: Reported on 08/12/2016) 30 tablet 7   No facility-administered medications prior to visit.     Allergies  Allergen Reactions  . Penicillins Rash    Has patient had a PCN reaction causing immediate rash, facial/tongue/throat swelling, SOB or lightheadedness with hypotension: unknown, childhood reaction    ROS As per HPI  PE: Blood pressure 125/89, pulse 84, temperature 98 F (36.7 C), temperature source Oral, resp. rate 16, height  (1.702 m), weight 236 lb (107 kg), SpO2 98 %. Gen: Alert, well appearing.  Patient is oriented to person, place, time, and situation. AFFECT: pleasant, lucid thought and speech. CV: RRR, no m/r/g.   LUNGS: CTA bilat, nonlabored resps, good aeration in all  lung fields. Chest wall: TTP over lower-most rib and intercostal muscle on L>R.  Extends around into mid axillary region on L.  No crepitus or bruising or deformity.  LABS:  none  IMPRESSION AND PLAN:  Chest wall pain secondary to acute intercostal strain. Xray has been neg for fracture. Discussed with pt sometimes this kind of thing gets worse before it gets better. Will d/c vicodin and start oxycodone , 1-2 tabs tid prn, #30.  This can cause her a little nausea so I did rx zofran , 1 tab tid prn, #30. Apply heat 20 min bid to the affected areas. Offered sports med referral but she wants to think about it at this  time.  An After Visit Summary was printed and given to the patient.  FOLLOW UP: Return in about 1 week (around 08/19/2016) for f/u chest wall pain.  Signed:  Santiago Bumpers, MD           08/12/2016

## 2016-08-12 NOTE — Progress Notes (Signed)
Pre visit review using our clinic review tool, if applicable. No additional management support is needed unless otherwise documented below in the visit note. 

## 2016-08-21 ENCOUNTER — Ambulatory Visit: Payer: No Typology Code available for payment source | Admitting: Family Medicine

## 2016-08-21 ENCOUNTER — Encounter: Payer: Self-pay | Admitting: Family Medicine

## 2016-10-03 DIAGNOSIS — Z8719 Personal history of other diseases of the digestive system: Secondary | ICD-10-CM

## 2016-10-03 HISTORY — DX: Personal history of other diseases of the digestive system: Z87.19

## 2017-01-16 ENCOUNTER — Ambulatory Visit (INDEPENDENT_AMBULATORY_CARE_PROVIDER_SITE_OTHER): Payer: No Typology Code available for payment source | Admitting: Family Medicine

## 2017-01-16 ENCOUNTER — Telehealth: Payer: Self-pay | Admitting: Family Medicine

## 2017-01-16 ENCOUNTER — Encounter: Payer: Self-pay | Admitting: Family Medicine

## 2017-01-16 VITALS — BP 124/79 | HR 89 | Temp 98.4°F | Resp 16 | Ht 67.0 in | Wt 232.5 lb

## 2017-01-16 DIAGNOSIS — G43909 Migraine, unspecified, not intractable, without status migrainosus: Secondary | ICD-10-CM | POA: Diagnosis not present

## 2017-01-16 DIAGNOSIS — Z8719 Personal history of other diseases of the digestive system: Secondary | ICD-10-CM | POA: Diagnosis not present

## 2017-01-16 MED ORDER — RIZATRIPTAN BENZOATE 10 MG PO TABS: 10.0000 mg | ORAL_TABLET | ORAL | 0 refills | Status: DC | PRN

## 2017-01-16 NOTE — Progress Notes (Addendum)
OFFICE VISIT  01/16/2017   CC:  Chief Complaint  Patient presents with  . Headache    x 1 week   HPI:    Patient is a 36 y.o. Caucasian female with a reported history of chronic sinusitis who presents for headache. Also remote history of migraine HA's which she reports resolved about 10-15 yrs ago.  Feels lots of pressure in maxillary region, behind eyes, sometimes top of head.  Throbbing. Occurring off and on for about 3 mo.  Occ spinning sensation when she would lay down at night and close her eyes.  None of this in last 1 mo.  Occ has nausea with these HAs.  Sometimes photophobia and phonophobia.  No visual abnormality associated.  No prodrome.  Sometimes the sensation in face/head is more a severe pressure and not pain. No nasal congestion or runny nose.  No ST or cough.  No fevers. Takes 600 mg ibuprofen + chlortrimeton for HA and it helps for a while.   No known triggers.  Sleeping more than in the past and not helping.  Can occur any time of day.  Back on October 28 2016, she felt a hard lump in L inferomandibular region.  Went to ED in [facility] [place]--she brought in her patient discharge paperwork for me to review today. She had L sided facial swelling that was very tender to touch.  Dx'd with parotitis and rx'd flagyl and keflex.   At end of abx course she felt completely back to normal.  Past Medical History:  Diagnosis Date  . Anxiety   . Cervical spondylosis   . Degenerative disc disease   . Depression    Situational  . Eclampsia    Seizure 1 wk after delivery of 3rd child.  Marland Kitchen GERD (gastroesophageal reflux disease)   . Hemorrhoids   . History of chronic sinusitis 2012  . History of vitamin D deficiency    Pt says she took replacement dosing and then daily maintenance but no vit D level was rechecked  . Hx of gestational diabetes mellitus, not currently pregnant   . Lumbar spondylosis   . Migraine    Quiescent since her mid 73s  . NSVD (normal spontaneous  vaginal delivery)    x 3  . Ruptured cervical disc   . 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 Lovell Sheehan.  . Wears glasses     Past Surgical History:  Procedure Laterality Date  . CHOLECYSTECTOMY N/A 07/05/2015   Procedure: LAPAROSCOPIC CHOLECYSTECTOMY WITH INTRAOPERATIVE CHOLANGIOGRAM;  Surgeon: Manus Rudd, MD;  Location: [facility] OR;  Service: General;  Laterality: N/A;  . DILATION AND EVACUATION  1999  . WISDOM TOOTH EXTRACTION     Social History   Social History  . Marital status: Married    Spouse name: N/A  . Number of children: N/A  . Years of education: N/A   Occupational History  . Not on file.   Social History Main Topics  . Smoking status: Former Smoker    Packs/day: 0.50    Types: Cigarettes    Quit date: 05/15/2016  . Smokeless tobacco: Never Used  . Alcohol use Yes     Comment: socially  . Drug use: No  . Sexual activity: Yes    Birth control/ protection: IUD   Other Topics Concern  . Not on file   Social History Narrative  Single, 3 children (13 yr, 11 yr, 7 yr).   Occupation: CNA at [employer].   From [place].   Tobacco: 10 pack-yr hx--quitting with e-cigs as of 07/2013.   Alcohol: rare.     No drugs.     Outpatient Medications Prior to Visit  Medication Sig Dispense Refill  . levonorgestrel (MIRENA) 20 MCG/24HR IUD 1 each by Intrauterine route once.    . metaxalone (SKELAXIN) 800 MG tablet Take 1 tablet (800 mg total) by mouth 4 (four) times daily as needed. 120 tablet 5  . ondansetron (ZOFRAN) 8 MG tablet Take 1 tablet (8 mg total) by mouth every 8 (eight) hours as needed for nausea or vomiting. 30 tablet 0  . oxyCODONE (OXY IR/ROXICODONE) 5 MG immediate release tablet 1-2 TABS PO TID PRN SEVERE PAIN 30 tablet 0  . citalopram (CELEXA) 40 MG tablet Take 1 tablet (40 mg total) by mouth daily. (Patient not taking: Reported on  01/16/2017) 30 tablet 7  . fluticasone (FLONASE) 50 MCG/ACT nasal spray Place 2 sprays into both nostrils daily. (Patient not taking: Reported on 01/16/2017) 16 g 12  . Vitamin D, Ergocalciferol, (DRISDOL) 50000 units CAPS capsule Take 50,000 Units by mouth every 7 (seven) days.     No facility-administered medications prior to visit.     Allergies  Allergen Reactions  . Penicillins Rash    Has patient had a PCN reaction causing immediate rash, facial/tongue/throat swelling, SOB or lightheadedness with hypotension: unknown, childhood reaction    ROS As per HPI  PE: Blood pressure 124/79, pulse 89, temperature 98.4 F (36.9 C), temperature source Oral, resp. rate 16, height  (1.702 m), weight 232 lb 8 oz (105.5 kg), SpO2 96 %. Gen: Alert, well appearing.  Patient is oriented to person, place, time, and situation. AFFECT: pleasant, lucid thought and speech. ENT: Ears: EACs clear, normal epithelium.  TMs with good light reflex and landmarks bilaterally.  Eyes: no injection, icteris, swelling, or exudate.  EOMI, PERRLA. Nose: no drainage or turbinate edema/swelling.  No injection or focal lesion.  Mouth: lips without lesion/swelling.  Oral mucosa pink and moist.  Dentition intact and without obvious caries or gingival swelling.  Oropharynx without erythema, exudate, or swelling. No swelling, nodule, erythema, or tenderness of parotid or submandibular glands.   Neck - No masses or thyromegaly or limitation in range of motion CV: RRR, no m/r/g.   LUNGS: CTA bilat, nonlabored resps, good aeration in all lung fields. EXT: no clubbing, cyanosis, or edema.  Neuro: CN 2-12 intact bilaterally, strength 5/5 in proximal and distal upper extremities and lower extremities bilaterally.    No tremor.  FNF normal bilat.  No ataxia.  Upper extremity and lower extremity DTRs symmetric.  No pronator drift.   LABS:    Chemistry      Component Value Date/Time   NA 141 06/27/2015 1457   K 4.2 06/27/2015  1457   CL 106 06/27/2015 1457   CO2 29 06/27/2015 1457   BUN 5 (L) 06/27/2015 1457   CREATININE 0.73 06/27/2015 1457      Component Value Date/Time   CALCIUM 8.9 06/27/2015 1457   ALKPHOS 66 06/27/2015 1457   AST 14 06/27/2015 1457   ALT 21 06/27/2015 1457   BILITOT 0.4 06/27/2015 1457      IMPRESSION AND PLAN:  1) Migraine Headaches; frequent x 3 months. No red flags for intracranial pathology. Reassured pt that these are VERY UNLIKELY to be related to her recent hx of  parotitis 10/2016. Start trial of abortive med: maxalt , 1 tab at onset of HA, may repeat dose in 2 hrs if not signif improved. Therapeutic expectations and side effect profile of medication discussed today.  Patient's questions answered. If/when we find a good abortive med.  We can then decide, depending on how frequent she has to use this med, whether or not to start a migraine prophylactic med.  2) Left parotitis 10/2016: resolved with keflex and flagyl. No residual sx's.  She had a CT scan on a disc today but our computer was unable to access any reports or images, likely b/c specific radiology software is required.  I told her to keep the disc at home just in case radiology ever needed to look at it.  An After Visit Summary was printed and given to the patient.  FOLLOW UP: Return in about 2 weeks (around 01/30/2017) for f/u HA's.  Signed:  Santiago Bumpers, MD           01/16/2017

## 2017-01-16 NOTE — Telephone Encounter (Signed)
Please advise. Thanks.  

## 2017-01-16 NOTE — Telephone Encounter (Signed)
Pls call and get more info:  I see no imaging regarding her statement of having "peritonitis".  When was this and where was it done.  Any abdominal complaints at this time? Pls ask if she has taken anything for her headache.  Any associated symptoms such as dizziness, URI symptoms, extra stress lately, possible dehydration? Let me know-thx

## 2017-01-16 NOTE — Telephone Encounter (Signed)
Patient calling to request an appt today for the following:  1.  Headache with pressure in head and behind eyes.  2. Peritonitis - states she had an x-ray done when she was in the hospital, she wants Dr. Milinda Cave to review.  There are no available 30 minute appts today to offer patient.  Please advise if patient can be worked in today.

## 2017-01-16 NOTE — Telephone Encounter (Signed)
SW pt and she stated that she was seen at [facility] in June for "parapitsis". She stated that she has an infection in her face that caused her face to swell and turn red. She was treated with one dose of IV antibiotics and give Rx for oral antibiotic at d/c. She stated that the swelling and redness have resolved but she is concerned that the headache may be related. She has had the headache for one week and has tried IBP and generic allergy medication which has given her some relief.   Pt stated that she did not have peritonitis, it was "parapitsis" (I had pt spell it out).   SW Dr. Milinda Cave and he agreed to see pt for her headache.   Apt made for today at 10:45am.

## 2017-01-30 ENCOUNTER — Ambulatory Visit (INDEPENDENT_AMBULATORY_CARE_PROVIDER_SITE_OTHER): Payer: No Typology Code available for payment source | Admitting: Family Medicine

## 2017-01-30 ENCOUNTER — Encounter: Payer: Self-pay | Admitting: Family Medicine

## 2017-01-30 VITALS — BP 136/85 | HR 106 | Temp 98.3°F | Resp 16 | Ht 67.0 in | Wt 233.0 lb

## 2017-01-30 DIAGNOSIS — G43909 Migraine, unspecified, not intractable, without status migrainosus: Secondary | ICD-10-CM

## 2017-01-30 MED ORDER — CITALOPRAM HYDROBROMIDE 40 MG PO TABS: 40.0000 mg | ORAL_TABLET | Freq: Every day | ORAL | 7 refills | Status: DC

## 2017-01-30 MED ORDER — TOPIRAMATE 25 MG PO CPSP: ORAL_CAPSULE | ORAL | 0 refills | Status: DC

## 2017-01-30 MED ORDER — TOPIRAMATE 50 MG PO TABS: ORAL_TABLET | ORAL | 0 refills | Status: DC

## 2017-01-30 MED ORDER — RIZATRIPTAN BENZOATE 10 MG PO TABS: 10.0000 mg | ORAL_TABLET | ORAL | 6 refills | Status: DC | PRN

## 2017-01-30 NOTE — Progress Notes (Signed)
OFFICE VISIT  01/30/2017   CC:  Chief Complaint  Patient presents with  . Follow-up    HA   HPI:    Patient is a 36 y.o.  female who presents for 2 week f/u HA's. I felt like she had a migraine syndrome, started maxalt  as abortive med.  Has taken maxalt about 5 times in last 2 weeks. Still has HA almost every day but maxalt does get rid of them effectively. Trigger noted this week is some LED lights and staring at lines on computer screen lately. No nausea.  No vision abnormalities. Pressure in sinuses/behind eyes totally resolves 30 min after taking maxalt.  Past Medical History:  Diagnosis Date  . Anxiety   . Cervical spondylosis   . Degenerative disc disease   . Depression    Situational  . Eclampsia    Seizure 1 wk after delivery of 3rd child.  Marland Kitchen GERD (gastroesophageal reflux disease)   . Hemorrhoids   . History of chronic sinusitis 2012  . History of vitamin D deficiency    Pt says she took replacement dosing and then daily maintenance but no vit D level was rechecked  . Hx of gestational diabetes mellitus, not currently pregnant   . Hx of parotitis 10/2016   Left sided: dx'd at ED in [facility], rx'd keflex and flagyl and recovered uneventfully.  . Lumbar spondylosis   . Migraine    Quiescent since her mid 52s.  Resurgence 2018  . Ruptured cervical disc   . 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 Lovell Sheehan.    Past Surgical History:  Procedure Laterality Date  . CHOLECYSTECTOMY N/A 07/05/2015   Procedure: LAPAROSCOPIC CHOLECYSTECTOMY WITH INTRAOPERATIVE CHOLANGIOGRAM;  Surgeon: Manus Rudd, MD;  Location: [facility] OR;  Service: General;  Laterality: N/A;  . DILATION AND EVACUATION  1999  . WISDOM TOOTH EXTRACTION      Outpatient Medications Prior to Visit  Medication Sig Dispense Refill  . cholecalciferol (VITAMIN D) 1000 units tablet Take 3,000  Units by mouth daily.    . fluticasone (FLONASE) 50 MCG/ACT nasal spray Place 2 sprays into both nostrils daily. 16 g 12  . levonorgestrel (MIRENA) 20 MCG/24HR IUD 1 each by Intrauterine route once.    . metaxalone (SKELAXIN) 800 MG tablet Take 1 tablet (800 mg total) by mouth 4 (four) times daily as needed. 120 tablet 5  . ondansetron (ZOFRAN) 8 MG tablet Take 1 tablet (8 mg total) by mouth every 8 (eight) hours as needed for nausea or vomiting. 30 tablet 0  . oxyCODONE (OXY IR/ROXICODONE) 5 MG immediate release tablet 1-2 TABS PO TID PRN SEVERE PAIN 30 tablet 0  . citalopram (CELEXA) 40 MG tablet Take 1 tablet (40 mg total) by mouth daily. 30 tablet 7  . rizatriptan (MAXALT) 10 MG tablet Take 1 tablet (10 mg total) by mouth as needed for migraine. May repeat in 2 hours if needed 10 tablet 0   No facility-administered medications prior to visit.     Allergies  Allergen Reactions  . Penicillins Rash    Has patient had a PCN reaction causing immediate rash, facial/tongue/throat swelling, SOB or lightheadedness with hypotension: unknown, childhood reaction    ROS As per HPI  PE: Blood pressure 136/85, pulse (!) 106, temperature 98.3 F (36.8 C), temperature source Oral, resp. rate 16, height  (1.702 m), weight 233  lb (105.7 kg), SpO2 99 %. Gen: Alert, well appearing.  Patient is oriented to person, place, time, and situation. AFFECT: pleasant, lucid thought and speech. CV: RRR, no m/r/g.   LUNGS: CTA bilat, nonlabored resps, good aeration in all lung fields. Neuro: CN 2-12 intact bilaterally, strength 5/5 in proximal and distal upper extremities and lower extremities bilaterally.   No tremor.   No ataxia.  Upper extremity and lower extremity DTRs symmetric.  No pronator drift.   LABS:  none  IMPRESSION AND PLAN:  Migraine HA syndrome: Great results with use of maxalt as abortive med. Due to frequency of HA's/need for maxalt, will start migraine prophylactic med:  topamax. Start  bid x 7d, then  bid x 7d.  Then start  tabs, 1 and 1/2 tabs bid until I see her again in 1 mo.  An After Visit Summary was printed and given to the patient.   FOLLOW UP: Return in about 4 weeks (around 02/27/2017) for f/u HAs.  Signed:  Santiago Bumpers, MD           01/30/2017

## 2017-03-04 ENCOUNTER — Telehealth: Payer: Self-pay | Admitting: Family Medicine

## 2017-03-04 ENCOUNTER — Encounter: Payer: Self-pay | Admitting: Family Medicine

## 2017-03-04 ENCOUNTER — Ambulatory Visit (HOSPITAL_BASED_OUTPATIENT_CLINIC_OR_DEPARTMENT_OTHER)
Admission: RE | Admit: 2017-03-04 | Discharge: 2017-03-04 | Disposition: A | Discharge status: Home / Self Care | Payer: Commercial Managed Care - PPO | Source: Ambulatory Visit | Attending: Family Medicine | Admitting: Family Medicine

## 2017-03-04 ENCOUNTER — Encounter: Payer: Self-pay | Admitting: *Deleted

## 2017-03-04 ENCOUNTER — Ambulatory Visit (INDEPENDENT_AMBULATORY_CARE_PROVIDER_SITE_OTHER): Payer: Commercial Managed Care - PPO | Admitting: Family Medicine

## 2017-03-04 VITALS — BP 117/72 | HR 98 | Temp 98.1°F | Resp 16 | Ht 67.0 in | Wt 236.5 lb

## 2017-03-04 DIAGNOSIS — M549 Dorsalgia, unspecified: Secondary | ICD-10-CM

## 2017-03-04 DIAGNOSIS — M545 Low back pain, unspecified: Secondary | ICD-10-CM

## 2017-03-04 DIAGNOSIS — R51 Headache: Secondary | ICD-10-CM

## 2017-03-04 DIAGNOSIS — M47814 Spondylosis without myelopathy or radiculopathy, thoracic region: Secondary | ICD-10-CM | POA: Diagnosis not present

## 2017-03-04 DIAGNOSIS — M542 Cervicalgia: Secondary | ICD-10-CM | POA: Diagnosis not present

## 2017-03-04 DIAGNOSIS — M546 Pain in thoracic spine: Secondary | ICD-10-CM

## 2017-03-04 DIAGNOSIS — M8580 Other specified disorders of bone density and structure, unspecified site: Secondary | ICD-10-CM | POA: Insufficient documentation

## 2017-03-04 DIAGNOSIS — M4854XA Collapsed vertebra, not elsewhere classified, thoracic region, initial encounter for fracture: Secondary | ICD-10-CM | POA: Insufficient documentation

## 2017-03-04 DIAGNOSIS — M4186 Other forms of scoliosis, lumbar region: Secondary | ICD-10-CM | POA: Insufficient documentation

## 2017-03-04 DIAGNOSIS — Z975 Presence of (intrauterine) contraceptive device: Secondary | ICD-10-CM | POA: Insufficient documentation

## 2017-03-04 DIAGNOSIS — R519 Headache, unspecified: Secondary | ICD-10-CM

## 2017-03-04 MED ORDER — METAXALONE 800 MG PO TABS: 800.0000 mg | ORAL_TABLET | Freq: Four times a day (QID) | ORAL | 2 refills | Status: DC | PRN

## 2017-03-04 MED ORDER — MELOXICAM 15 MG PO TABS: ORAL_TABLET | ORAL | 2 refills | Status: DC

## 2017-03-04 NOTE — Progress Notes (Signed)
OFFICE VISIT  03/04/2017   CC:  Chief Complaint  Patient presents with  . Back Pain    lower     HPI:    Patient is a 36 y.o. Caucasian female who presents for neck and back pain. Has hx of thoracic and cervical spine pain for over a decade.  (I last saw her for neck pain with radiculopathy 11/2014).  She last saw Dr. Lovell SheehanJenkins after this and he ordered a repeat MRI of neck and back but pt says she was never called to schedule this.  Onset yesterday of pain in midline of low back, severe, "couldn't hardly walk".  The pain does not radiate down legs, no paresthesias. About 1 week of mild pain in neck.  Has some occ radiation of pain down each arm, alternating, some "tinglies" in hands bilat. Hx of recent "crick" in neck.  Mobility of neck intact. 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  Took 800 mg ibup yesterday and it didn't help the pain.  No loss of bowel/bladder function, no saddle anesthesia.  Of note, I last saw her for HA's, suspected migraine syndrome.  Rx'd topamax preventative and maxalt abortive med. Topamax was on back-order so she never started it.  However, she now things her HA's were coming from nicotine w/drawal. She has not had a HA in 2 weeks.  Had to use maxalt only once and it did work.  Past Medical History:  Diagnosis Date  . Anxiety   . Cervical spondylosis   . Degenerative disc disease   . Depression    Situational  . Eclampsia    Seizure 1 wk after delivery of 3rd child.  Marland Kitchen. GERD (gastroesophageal reflux disease)   . Hemorrhoids   . History of chronic sinusitis 2012  . History of vitamin D deficiency    Pt says she took replacement dosing and then daily maintenance but no vit D level was rechecked  . Hx of gestational diabetes mellitus, not currently pregnant   . Hx of parotitis 10/2016   [place] sided[place] rx'd keflex and flagyl and recovered uneventfully.  .  Lumbar spondylosis   . Migraine    Quiescent since her mid 5120s.  Resurgence 2018  . Ruptured cervical disc   . 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 Lovell SheehanJenkins.    Past Surgical History:  Procedure Laterality Date  . CHOLECYSTECTOMY N/A 07/05/2015   Procedure: LAPAROSCOPIC CHOLECYSTECTOMY WITH INTRAOPERATIVE CHOLANGIOGRAM;  Surgeon: Manus RuddMatthew Tsuei, MD;  Location: [facility] OR;  Service: General;  Laterality: N/A;  . DILATION AND EVACUATION  1999  . WISDOM TOOTH EXTRACTION      Outpatient Medications Prior to Visit  Medication Sig Dispense Refill  . cholecalciferol (VITAMIN D) 1000 units tablet Take 3,000 Units by mouth daily.    . citalopram (CELEXA) 40 MG tablet Take 1 tablet (40 mg total) by mouth daily. 30 tablet 7  . fluticasone (FLONASE) 50 MCG/ACT nasal spray Place 2 sprays into both nostrils daily. 16 g 12  . levonorgestrel (MIRENA) 20 MCG/24HR IUD 1 each by Intrauterine route once.    . ondansetron (ZOFRAN) 8 MG tablet Take 1 tablet (8 mg total) by mouth every 8 (eight) hours as needed for nausea or vomiting. 30 tablet 0  . rizatriptan (MAXALT) 10  MG tablet Take 1 tablet (10 mg total) by mouth as needed for migraine. May repeat in 2 hours if needed 10 tablet 6  . metaxalone (SKELAXIN) 800 MG tablet Take 1 tablet (800 mg total) by mouth 4 (four) times daily as needed. 120 tablet 5  . oxyCODONE (OXY IR/ROXICODONE) 5 MG immediate release tablet 1-2 TABS PO TID PRN SEVERE PAIN (Patient not taking: Reported on 03/04/2017) 30 tablet 0  . topiramate (TOPAMAX) 25 MG capsule 1 tab po bid x 7d.  Then take 2 tabs po bid x 7d. (Patient not taking: Reported on 03/04/2017) 42 capsule 0  . topiramate (TOPAMAX) 50 MG tablet 1 and 1/2 tabs po bid (Patient not taking: Reported on 03/04/2017) 45 tablet 0   No facility-administered medications prior to visit.     Allergies  Allergen Reactions  .  Penicillins Rash    Has patient had a PCN reaction causing immediate rash, facial/tongue/throat swelling, SOB or lightheadedness with hypotension: unknown, childhood reaction    ROS As per HPI  PE: Blood pressure 117/72, pulse 98, temperature 98.1 F (36.7 C), temperature source Oral, resp. rate 16, height 5\' 7"  (1.702 m), weight 236 lb 8 oz (107.3 kg), SpO2 97 %.  Pt examined with Wallace Keller, CMA, as chaperone.  Gen: Alert, well appearing.  Patient is oriented to person, place, time, and situation. AFFECT: pleasant, lucid thought and speech. Neck: ROM limited slightly in flexion/extension and lateral flexion/bending due to pain, rotation is ok.  Same findings in L spine. Mild discomfort to palpation of midline cervical, thoracic, and L spine as well as paraspinous soft tissues from neck to L spine. Sitting SLR neg bilat. LE strength 5/5 prox/dist bilat. DTRs in UE's and LE's intact/symmetric.   LABS:    Chemistry      Component Value Date/Time   NA 141 06/27/2015 1457   K 4.2 06/27/2015 1457   CL 106 06/27/2015 1457   CO2 29 06/27/2015 1457   BUN 5 (L) 06/27/2015 1457   CREATININE 0.73 06/27/2015 1457      Component Value Date/Time   CALCIUM 8.9 06/27/2015 1457   ALKPHOS 66 06/27/2015 1457   AST 14 06/27/2015 1457   ALT 21 06/27/2015 1457   BILITOT 0.4 06/27/2015 1457     Lab Results  Component Value Date   WBC 12.2 (H) 07/03/2015   HGB 14.7 07/03/2015   HCT 44.9 07/03/2015   MCV 86.2 07/03/2015   PLT 253 07/03/2015   MRI C spine w/out contrast 12/26/11: IMPRESSION: 1.  Mild degenerative cervical spinal stenosis from C3-C4 to C5-C6, most pronounced at the latter.  No cervical spinal cord signal abnormality despite mild cord mass effect. 2.  See thoracic findings below. 3.  Multifactorial mild to moderate bilateral C5 and C6 foraminal stenosis, appears mildly progressed at the latter.  MRI Thoracic spine w/out contrast 12/26/11: IMPRESSION: 1.  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 2.  No acute thoracic spine or spinal cord findings. 3.  Chronic disc desiccation at T8-T9 and T11-T12 associated with mild degenerative endplate changes.  IMPRESSION AND PLAN:  1) Musculoskeletal cervical, thoracic, and lumbar spine pain/strain. 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 Plan: PT referral to [facility] rehab in [place]. Start skelaxin and mobic--see med section of EMR. Check plain films of C, T, and L spine. I don't think any of her pain is related to her  hx of mild DDD or her thoracic syrinx. Note excusing her from work 10/30-11/7/18.  If worsening or not improved at f/u in 4 wks, she'll need to return to see her neurosurgeon, Dr. Lovell Sheehan.  2) HA syndrome--possibly nicotine w/drawal-induced migraines. These have largely resolved. Hold off on starting topamax now. May continue maxalt prn and we'll see how frequently she has to use this over the next few months.  An After Visit Summary was printed and given to the patient.  FOLLOW UP: Return in about 4 weeks (around 04/01/2017) for f/u neck and back pain.  Signed:  Santiago Bumpers, MD           03/04/2017

## 2017-03-04 NOTE — Telephone Encounter (Signed)
Patient Name: Jenna Taylor  DOB: February 06, 1981    Initial Comment Caller states she's having back pain. Neck pain. Having trouble walking.   Nurse Assessment  Nurse: Renaldo FiddlerAdkins, RN, Raynelle FanningJulie Date/Time Lamount Cohen(Eastern Time): 03/04/2017 8:45:54 AM  Confirm and document reason for call. If symptomatic, describe symptoms. ---Caller states she's having back pain, ongoing neck pain which has increased and is having trouble walking. She has a hx of back pain, but this pain is new. She has HNP x 2 in Cervical Spine and Sciatica.  Does the patient have any new or worsening symptoms? ---Yes  Will a triage be completed? ---Yes  Related visit to physician within the last 2 weeks? ---No  Does the PT have any chronic conditions? (i.e. diabetes, asthma, etc.) ---Yes  List chronic conditions. ---HNP C-3/4, Sciatica, Depression, IUD, Hx Migraines  Is the patient pregnant or possibly pregnant? (Ask all females between the ages of 2012-55) ---No  Is this a behavioral health or substance abuse call? ---No     Guidelines    Guideline Title Affirmed Question Affirmed Notes  Back Pain [1] SEVERE back pain (e.g., excruciating, unable to do any normal activities) AND [2] not improved 2 hours after pain medicine    Final Disposition User   See Physician within 4 Hours (or PCP triage) Renaldo FiddlerAdkins, RN, Raynelle FanningJulie    Referrals  REFERRED TO PCP OFFICE

## 2017-03-04 NOTE — Telephone Encounter (Signed)
Noted  

## 2017-03-04 NOTE — Telephone Encounter (Signed)
Pt has apt today at 11:30am with Dr. McGowen. 

## 2017-03-05 ENCOUNTER — Encounter: Payer: Self-pay | Admitting: Family Medicine

## 2017-03-05 ENCOUNTER — Other Ambulatory Visit: Payer: Self-pay | Admitting: Family Medicine

## 2017-03-05 DIAGNOSIS — M8588 Other specified disorders of bone density and structure, other site: Secondary | ICD-10-CM

## 2017-03-05 DIAGNOSIS — S22080A Wedge compression fracture of T11-T12 vertebra, initial encounter for closed fracture: Secondary | ICD-10-CM

## 2017-03-06 ENCOUNTER — Telehealth: Payer: Self-pay | Admitting: *Deleted

## 2017-03-06 NOTE — Telephone Encounter (Signed)
Just some degenerative or "wear and tear" type of arthritis changes.  -thx

## 2017-03-06 NOTE — Telephone Encounter (Signed)
Pt advised, she didn't seem satisfied with this. She asked if it still showed her disc bulge. I advised her again what Dr. Milinda CaveMcGowen said. She then said that it was on her MRI. I advised her that it may not show up on the xray. She voiced understanding of that. Then asked what type of arthritis does she have. I advised her that she has "wear and tear" arthritis, comes as we get older. She then stated "at 36?". I advised her again that this is what was seen on her xray. She then stated that she will can her neurologist and get an apt with them.

## 2017-03-06 NOTE — Telephone Encounter (Signed)
Pt called ask for results of her neck xray. Please advise. Thanks.

## 2017-04-02 ENCOUNTER — Ambulatory Visit: Payer: Commercial Managed Care - PPO | Admitting: Family Medicine

## 2017-04-20 ENCOUNTER — Encounter: Payer: Self-pay | Admitting: Family Medicine

## 2017-04-30 ENCOUNTER — Ambulatory Visit: Payer: Self-pay | Admitting: *Deleted

## 2017-04-30 ENCOUNTER — Ambulatory Visit: Payer: Commercial Managed Care - PPO | Admitting: Adult Health

## 2017-04-30 ENCOUNTER — Ambulatory Visit (INDEPENDENT_AMBULATORY_CARE_PROVIDER_SITE_OTHER)
Admission: RE | Admit: 2017-04-30 | Discharge: 2017-04-30 | Disposition: A | Discharge status: Home / Self Care | Payer: Commercial Managed Care - PPO | Source: Ambulatory Visit | Attending: Adult Health | Admitting: Adult Health

## 2017-04-30 ENCOUNTER — Encounter: Payer: Self-pay | Admitting: Adult Health

## 2017-04-30 DIAGNOSIS — R1011 Right upper quadrant pain: Secondary | ICD-10-CM

## 2017-04-30 LAB — POC URINALSYSI DIPSTICK (AUTOMATED)
Bilirubin, UA: NEGATIVE
Blood, UA: NEGATIVE
Glucose, UA: NEGATIVE
KETONES UA: NEGATIVE
LEUKOCYTES UA: NEGATIVE
Nitrite, UA: NEGATIVE
PH UA: 6 (ref 5.0–8.0)
PROTEIN UA: NEGATIVE
SPEC GRAV UA: 1.015 (ref 1.010–1.025)
UROBILINOGEN UA: 0.2 U/dL

## 2017-04-30 LAB — CBC WITH DIFFERENTIAL/PLATELET
BASOS PCT: 0.4 % (ref 0.0–3.0)
Basophils Absolute: 0.1 10*3/uL (ref 0.0–0.1)
EOS PCT: 3.8 % (ref 0.0–5.0)
Eosinophils Absolute: 0.5 10*3/uL (ref 0.0–0.7)
HEMATOCRIT: 45.4 % (ref 36.0–46.0)
HEMOGLOBIN: 14.8 g/dL (ref 12.0–15.0)
LYMPHS PCT: 18.9 % (ref 12.0–46.0)
Lymphs Abs: 2.5 10*3/uL (ref 0.7–4.0)
MCHC: 32.6 g/dL (ref 30.0–36.0)
MCV: 85.5 fl (ref 78.0–100.0)
MONOS PCT: 4.4 % (ref 3.0–12.0)
Monocytes Absolute: 0.6 10*3/uL (ref 0.1–1.0)
Neutro Abs: 9.5 10*3/uL — ABNORMAL HIGH (ref 1.4–7.7)
Neutrophils Relative %: 72.5 % (ref 43.0–77.0)
Platelets: 287 10*3/uL (ref 150.0–400.0)
RBC: 5.31 Mil/uL — ABNORMAL HIGH (ref 3.87–5.11)
RDW: 13.6 % (ref 11.5–15.5)
WBC: 13.1 10*3/uL — ABNORMAL HIGH (ref 4.0–10.5)

## 2017-04-30 LAB — COMPREHENSIVE METABOLIC PANEL
ALBUMIN: 3.9 g/dL (ref 3.5–5.2)
ALK PHOS: 75 U/L (ref 39–117)
ALT: 14 U/L (ref 0–35)
AST: 11 U/L (ref 0–37)
BUN: 10 mg/dL (ref 6–23)
CALCIUM: 8.9 mg/dL (ref 8.4–10.5)
CO2: 29 mEq/L (ref 19–32)
Chloride: 102 mEq/L (ref 96–112)
Creatinine, Ser: 0.85 mg/dL (ref 0.40–1.20)
GFR: 79.48 mL/min (ref >60.00)
Glucose, Bld: 141 mg/dL — ABNORMAL HIGH (ref 70–99)
POTASSIUM: 4.2 meq/L (ref 3.5–5.1)
SODIUM: 136 meq/L (ref 135–145)
TOTAL PROTEIN: 6.9 g/dL (ref 6.0–8.3)
Total Bilirubin: 0.4 mg/dL (ref 0.2–1.2)

## 2017-04-30 LAB — POCT URINE PREGNANCY: PREG TEST UR: NEGATIVE

## 2017-04-30 MED ORDER — IOPAMIDOL (ISOVUE-300) INJECTION 61%
100.0000 mL | Freq: Once | INTRAVENOUS | Status: AC | PRN
  Administered 2017-04-30: 100 mL via INTRAVENOUS

## 2017-04-30 NOTE — Telephone Encounter (Signed)
OK noted.

## 2017-04-30 NOTE — Progress Notes (Signed)
Subjective:    Patient ID: Jenna Taylor, female    DOB: October 10, 1980, 36 y.o.   MRN: [id number]  HPI  36 year old female who  has a past medical history of Anxiety, Cervical spondylosis, Compression fracture of thoracic vertebra (HCC), Degenerative disc disease, Depression, Eclampsia, GERD (gastroesophageal reflux disease), Hemorrhoids, History of chronic sinusitis (2012), History of vitamin D deficiency, gestational diabetes mellitus, not currently pregnant, parotitis (10/2016), Lumbar spondylosis, Migraine, Ruptured cervical disc, and Syrinx of spinal cord (HCC). She is a patient of Dr. Milinda CaveMcGowen, who I am seeing today for an acute issue of right sided abdominal pain. She reports feelings of distension and her stomach will become " very hard". She will sometimes have nausea but has not had any episodes of vomiting. Per patient " It feels like it did when I had to have my gallbladder removed."   She reports that her nausea started about a month ago and has become progressively worse over the last week. Her abdominal pain started two days ago but became " really bad" around 2:30 am this morning.   She denies any fevers.   She had a small BM this morning but did not have one yesterday. She is passing gas but not like she normally does.   Denies any urinary symptoms   Review of Systems See HPI   Past Medical History:  Diagnosis Date  . Anxiety   . Cervical spondylosis   . Compression fracture of thoracic vertebra (HCC)    Mild; T12  . Degenerative disc disease   . Depression    Situational  . Eclampsia    Seizure 1 wk after delivery of 3rd child.  Marland Kitchen. GERD (gastroesophageal reflux disease)   . Hemorrhoids   . History of chronic sinusitis 2012  . History of vitamin D deficiency    Pt says she took replacement dosing and then daily maintenance but no vit D level was rechecked  . Hx of gestational diabetes mellitus, not currently pregnant   . Hx of parotitis 10/2016   Left sided:  [place] [facility] [place] [facility] rx'd keflex and flagyl and recovered uneventfully.  . Lumbar spondylosis   . Migraine    Quiescent since her mid 5420s.  Resurgence 2018  . Ruptured cervical disc   . 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 Lovell SheehanJenkins.    Social History   Socioeconomic History  . Marital status: Married    Spouse name: Not on file  . Number of children: Not on file  . Years of education: Not on file  . Highest education level: Not on file  Social Needs  . Financial resource strain: Not on file  . Food insecurity - worry: Not on file  . Food insecurity - inability: Not on file  . Transportation needs - medical: Not on file  . Transportation needs - non-medical: Not on file  Occupational History  . Not on file  Tobacco Use  . Smoking status: Former Smoker    Packs/day: 0.50    Types: Cigarettes    Last attempt to quit: 05/15/2016    Years since quitting: 0.9  . Smokeless tobacco: Never Used  Substance and Sexual Activity  . Alcohol use: Yes    Comment: socially  . Drug use: No  . Sexual activity: Yes    Birth control/protection: IUD  Other Topics Concern  .  Not on file  Social History Narrative   Single, 3 children (13 yr, 11 yr, 7 yr).   Occupation: CNA at [employer].   From [place].   Tobacco: 10 pack-yr hx--quitting with e-cigs as of 07/2013.   Alcohol: rare.     No drugs.    Past Surgical History:  Procedure Laterality Date  . CHOLECYSTECTOMY N/A 07/05/2015   Procedure: LAPAROSCOPIC CHOLECYSTECTOMY WITH INTRAOPERATIVE CHOLANGIOGRAM;  Surgeon: Manus Rudd, MD;  Location: [facility] OR;  Service: General;  Laterality: N/A;  . DILATION AND EVACUATION  1999  . WISDOM TOOTH EXTRACTION      Family History  Problem Relation Age of Onset  . Depression Mother   . COPD Father   . Heart disease Father   . Clotting disorder Brother   . Osteoporosis  Maternal Grandmother   . Cancer Maternal Grandfather   . Kidney disease Maternal Grandfather   . Heart disease Maternal Grandfather   . Arthritis Paternal Grandmother   . Cancer Paternal Grandmother   . Cancer Paternal Grandfather     Allergies  Allergen Reactions  . Penicillins Rash    Has patient had a PCN reaction causing immediate rash, facial/tongue/throat swelling, SOB or lightheadedness with hypotension: unknown, childhood reaction    Current Outpatient Medications on File Prior to Visit  Medication Sig Dispense Refill  . cholecalciferol (VITAMIN D) 1000 units tablet Take 3,000 Units by mouth daily.    . citalopram (CELEXA) 40 MG tablet Take 1 tablet (40 mg total) by mouth daily. 30 tablet 7  . fluticasone (FLONASE) 50 MCG/ACT nasal spray Place 2 sprays into both nostrils daily. 16 g 12  . levonorgestrel (MIRENA) 20 MCG/24HR IUD 1 each by Intrauterine route once.    . meloxicam (MOBIC) 15 MG tablet 1 tap po qd prn--take with food 30 tablet 2  . metaxalone (SKELAXIN) 800 MG tablet Take 1 tablet (800 mg total) by mouth 4 (four) times daily as needed. 120 tablet 2  . ondansetron (ZOFRAN) 8 MG tablet Take 1 tablet (8 mg total) by mouth every 8 (eight) hours as needed for nausea or vomiting. 30 tablet 0  . oxyCODONE (OXY IR/ROXICODONE) 5 MG immediate release tablet Take 1-2 tablets every 6 hours as needed    . rizatriptan (MAXALT) 10 MG tablet Take 1 tablet (10 mg total) by mouth as needed for migraine. May repeat in 2 hours if needed 10 tablet 6   No current facility-administered medications on file prior to visit.     BP 122/90 (BP Location: Left Arm)   Temp 98.1 F (36.7 C) (Oral)   Wt 239 lb (108.4 kg)   BMI 37.43 kg/m        Objective:   Physical Exam  Constitutional: She is oriented to person, place, and time. She appears well-developed and well-nourished. No distress.  Cardiovascular: Normal rate, regular rhythm and normal heart sounds. Exam reveals no gallop and no  friction rub.  No murmur heard. Pulmonary/Chest: Effort normal and breath sounds normal. No respiratory distress. She has no wheezes. She has no rales. She exhibits no tenderness.  Abdominal: Soft. Bowel sounds are normal. She exhibits no distension and no mass. There is no hepatosplenomegaly. There is tenderness in the right upper quadrant, epigastric area and periumbilical area. There is no rigidity, no rebound, no guarding, no CVA tenderness, no tenderness at McBurney's point and negative Murphy's sign. No hernia.  Neurological: She is alert and oriented to person, place, and time.  Skin: Skin is  warm and dry. No rash noted. She is not diaphoretic. No erythema. No pallor.  Psychiatric: She has a normal mood and affect. Her behavior is normal. Judgment and thought content normal.  Nursing note and vitals reviewed.     Assessment & Plan:  1. Right upper quadrant pain - Will get lab work and CT of abdomen. Unknown cause at this time. Possible gallstone in bile duct?  - CT ABDOMEN PELVIS W CONTRAST; Future - CBC with Differential/Platelet - CMP - POCT Urinalysis Dipstick (Automated) - POCT urine pregnancy  Shirline Freesory Mylisa Brunson, NP

## 2017-04-30 NOTE — Telephone Encounter (Signed)
Pt c/o abd pain, the same like before her gallbladder was removed. States some nausea but no vomiting. No fevers. Care advice given. Appointment made.  Reason for Disposition . [1] MODERATE pain (e.g., interferes with normal activities) AND [2] comes and goes (cramps) AND [3] present > 24 hours  (Exception: pain with Vomiting or Diarrhea - see that Guideline)  Answer Assessment - Initial Assessment Questions 1. LOCATION: "Where does it hurt?"      Upper right abdomen 2. RADIATION: "Does the pain shoot anywhere else?" (e.g., chest, back)     everywhere 3. ONSET: "When did the pain begin?" (e.g., minutes, hours or days ago)      Christmas day, and again last night 4. SUDDEN: "Gradual or sudden onset?"     sudden 5. PATTERN "Does the pain come and go, or is it constant?"    - If constant: "Is it getting better, staying the same, or worsening?"      (Note: Constant means the pain never goes away completely; most serious pain is constant and it progresses)     - If intermittent: "How long does it last?" "Do you have pain now?"     (Note: Intermittent means the pain goes away completely between bouts)     constant 6. SEVERITY: "How bad is the pain?"  (e.g., Scale 1-10; mild, moderate, or severe)    - MILD (1-3): doesn't interfere with normal activities, abdomen soft and not tender to touch     - MODERATE (4-7): interferes with normal activities or awakens from sleep, tender to touch     - SEVERE (8-10): excruciating pain, doubled over, unable to do any normal activities       6 right now, can go up to 8 or 9 7. RECURRENT SYMPTOM: "Have you ever had this type of abdominal pain before?" If so, ask: "When was the last time?" and "What happened that time?"      Yes when her gallbladder was removed 8. AGGRAVATING FACTORS: "Does anything seem to cause this pain?" (e.g., foods, stress, alcohol)     movement 9. CARDIAC SYMPTOMS: "Do you have any of the following symptoms: chest pain, difficulty  breathing, sweating, nausea?"     Palpitations yesterday (took Vitamin D and it stopped), nausea 10. OTHER SYMPTOMS: "Do you have any other symptoms?" (e.g., fever, vomiting, diarrhea)       bloated 11. PREGNANCY: "Is there any chance you are pregnant?" "When was your last menstrual period?"       No, birth control  Protocols used: ABDOMINAL PAIN - UPPER-A-AH

## 2017-05-01 ENCOUNTER — Other Ambulatory Visit: Payer: Self-pay | Admitting: Neurosurgery

## 2017-05-01 ENCOUNTER — Telehealth: Payer: Self-pay | Admitting: Family Medicine

## 2017-05-01 DIAGNOSIS — M545 Low back pain: Principal | ICD-10-CM

## 2017-05-01 DIAGNOSIS — M542 Cervicalgia: Secondary | ICD-10-CM

## 2017-05-01 DIAGNOSIS — G8929 Other chronic pain: Secondary | ICD-10-CM

## 2017-05-01 NOTE — Telephone Encounter (Signed)
-----   Message from Shirline Freesory Nafziger, NP sent at 05/01/2017  9:31 AM EST ----- Dr. Milinda CaveMcGowen,   I saw one of your patients yesterday. She was complaining of nausea and less than 24 hours of RUQ pain. CT scan showed   Prominent portacaval lymph node which short axis dimension 1.3 cm. Increase number of normal size lymph nodes gastrohepatic region, peripancreatic region and within the small bowel mesenteries of questionable etiology/ significance.  I spoke with Radiology this morning about the reading and they could not give me a good answer on where to go next. Their recommendation was " conservative treatment.. Nothing. More aggressive treatment... Repeat scan in 3 months"  She did have a slightly elevated white count at 13.   When I spoke to her this morning she was still having pain and nausea.   I asked her to follow up with you.   Thanks  [employer]

## 2017-05-01 NOTE — Telephone Encounter (Signed)
(?  Mesenteric adenitis?).   Pls call pt and tell her I reviewed her recent o/v (blood work and CT scan). I think her lymph nodes seen on CT scan are secondary to a viral infection that commonly causes lymph node prominence in the abdomen like she has.  However, the lymph nodes return to normal within 1-2 months. I recommend we repeat her CT scan and CBC in 3 mo. If her abdominal pain or nausea significantly worsens over the weekend she should go to the emergency department.  Otherwise, encourage her to set up a f/u with me sometime next week.-thx

## 2017-05-01 NOTE — Telephone Encounter (Signed)
Pt advised and voiced understanding. She will call back to schedule a f/u, she needs to look at her calendar.

## 2017-05-05 DIAGNOSIS — E119 Type 2 diabetes mellitus without complications: Secondary | ICD-10-CM

## 2017-05-05 DIAGNOSIS — B9681 Helicobacter pylori [H. pylori] as the cause of diseases classified elsewhere: Secondary | ICD-10-CM

## 2017-05-05 DIAGNOSIS — K2970 Gastritis, unspecified, without bleeding: Secondary | ICD-10-CM

## 2017-05-05 HISTORY — DX: Type 2 diabetes mellitus without complications: E11.9

## 2017-05-05 HISTORY — PX: OTHER SURGICAL HISTORY: SHX169

## 2017-05-05 HISTORY — DX: Gastritis, unspecified, without bleeding: K29.70

## 2017-05-05 HISTORY — DX: Helicobacter pylori (H. pylori) as the cause of diseases classified elsewhere: B96.81

## 2017-05-09 ENCOUNTER — Ambulatory Visit
Admission: RE | Admit: 2017-05-09 | Discharge: 2017-05-09 | Disposition: A | Discharge status: Home / Self Care | Payer: Commercial Managed Care - PPO | Source: Ambulatory Visit | Attending: Neurosurgery | Admitting: Neurosurgery

## 2017-05-09 DIAGNOSIS — G8929 Other chronic pain: Secondary | ICD-10-CM

## 2017-05-09 DIAGNOSIS — M542 Cervicalgia: Principal | ICD-10-CM

## 2017-05-09 DIAGNOSIS — M545 Low back pain: Principal | ICD-10-CM

## 2017-05-14 ENCOUNTER — Encounter: Payer: Self-pay | Admitting: Family Medicine

## 2017-05-14 ENCOUNTER — Ambulatory Visit
Admission: RE | Admit: 2017-05-14 | Discharge: 2017-05-14 | Disposition: A | Discharge status: Home / Self Care | Payer: Commercial Managed Care - PPO | Source: Ambulatory Visit | Attending: Family Medicine | Admitting: Family Medicine

## 2017-05-14 ENCOUNTER — Ambulatory Visit (HOSPITAL_BASED_OUTPATIENT_CLINIC_OR_DEPARTMENT_OTHER)
Admission: RE | Admit: 2017-05-14 | Discharge: 2017-05-14 | Disposition: A | Discharge status: Home / Self Care | Payer: Commercial Managed Care - PPO | Source: Ambulatory Visit | Attending: Family Medicine | Admitting: Family Medicine

## 2017-05-14 ENCOUNTER — Ambulatory Visit: Payer: Commercial Managed Care - PPO | Admitting: Family Medicine

## 2017-05-14 VITALS — BP 122/80 | HR 95 | Temp 98.0°F | Ht 67.0 in | Wt 244.8 lb

## 2017-05-14 DIAGNOSIS — K2900 Acute gastritis without bleeding: Secondary | ICD-10-CM | POA: Diagnosis not present

## 2017-05-14 DIAGNOSIS — K5900 Constipation, unspecified: Secondary | ICD-10-CM | POA: Diagnosis not present

## 2017-05-14 DIAGNOSIS — R110 Nausea: Secondary | ICD-10-CM

## 2017-05-14 DIAGNOSIS — R1010 Upper abdominal pain, unspecified: Secondary | ICD-10-CM | POA: Diagnosis not present

## 2017-05-14 DIAGNOSIS — S22080A Wedge compression fracture of T11-T12 vertebra, initial encounter for closed fracture: Secondary | ICD-10-CM

## 2017-05-14 DIAGNOSIS — M8588 Other specified disorders of bone density and structure, other site: Secondary | ICD-10-CM

## 2017-05-14 LAB — CBC WITH DIFFERENTIAL/PLATELET
BASOS ABS: 0.1 10*3/uL (ref 0.0–0.1)
Basophils Relative: 0.8 % (ref 0.0–3.0)
EOS ABS: 0.5 10*3/uL (ref 0.0–0.7)
Eosinophils Relative: 4.2 % (ref 0.0–5.0)
HCT: 42.5 % (ref 36.0–46.0)
Hemoglobin: 14 g/dL (ref 12.0–15.0)
Lymphocytes Relative: 22.5 % (ref 12.0–46.0)
Lymphs Abs: 2.6 10*3/uL (ref 0.7–4.0)
MCHC: 33 g/dL (ref 30.0–36.0)
MCV: 85.2 fl (ref 78.0–100.0)
MONO ABS: 0.6 10*3/uL (ref 0.1–1.0)
Monocytes Relative: 4.9 % (ref 3.0–12.0)
Neutro Abs: 7.8 10*3/uL — ABNORMAL HIGH (ref 1.4–7.7)
Neutrophils Relative %: 67.6 % (ref 43.0–77.0)
Platelets: 267 10*3/uL (ref 150.0–400.0)
RBC: 4.98 Mil/uL (ref 3.87–5.11)
RDW: 13.7 % (ref 11.5–15.5)
WBC: 11.5 10*3/uL — ABNORMAL HIGH (ref 4.0–10.5)

## 2017-05-14 LAB — COMPREHENSIVE METABOLIC PANEL
ALBUMIN: 3.8 g/dL (ref 3.5–5.2)
ALT: 17 U/L (ref 0–35)
AST: 12 U/L (ref 0–37)
Alkaline Phosphatase: 73 U/L (ref 39–117)
BUN: 5 mg/dL — AB (ref 6–23)
CHLORIDE: 100 meq/L (ref 96–112)
CO2: 30 mEq/L (ref 19–32)
CREATININE: 0.82 mg/dL (ref 0.40–1.20)
Calcium: 9 mg/dL (ref 8.4–10.5)
GFR: 82.83 mL/min (ref >60.00)
Glucose, Bld: 241 mg/dL — ABNORMAL HIGH (ref 70–99)
Potassium: 4 mEq/L (ref 3.5–5.1)
SODIUM: 137 meq/L (ref 135–145)
TOTAL PROTEIN: 6.6 g/dL (ref 6.0–8.3)
Total Bilirubin: 0.4 mg/dL (ref 0.2–1.2)

## 2017-05-14 LAB — LIPASE: LIPASE: 27 U/L (ref 11.0–59.0)

## 2017-05-14 MED ORDER — PANTOPRAZOLE SODIUM 40 MG PO TBEC: 40.0000 mg | DELAYED_RELEASE_TABLET | Freq: Every day | ORAL | 1 refills | Status: DC

## 2017-05-14 NOTE — Patient Instructions (Signed)
Take over the counter, generic zantac 150mg  every night.

## 2017-05-14 NOTE — Progress Notes (Signed)
OFFICE VISIT  05/14/2017   CC:  Chief Complaint  Patient presents with  . Follow-up    abdominal pain    HPI:    Patient is a 37 y.o. Caucasian female who presents for f/u nausea and abd pain. On 04/30/17, she was seen by another [facility] provider for nausea and abd pain. CT abd showed some scattered small LN's and her WBC count was slightly elevated, o/w CT fine. I plan is to f/u with repeat CT abd/pelv and CBC in 3 mo---? Suspect mesenteric lymphadenitis vs acute gastritis.  Says she is not changed any since 04/30/17.  Feels RUQ pain, nausea.  No vomiting.  Denies reflux. Sx's not consistently related to eating.  No fever.  The pain does extend intermittently over mid epigastric area and LUQ. Says legs feel swollen.   Still eating and drinking, just less than normal.  All these sx's started the night of 04/29/17.  She won't take her zofran b/c she gets too sleepy on it. Not taking oxycodone recently.  Of note, last time I saw her was for musculoskeletal back pain.  I referred her to PT and placed her on muscle relaxer and NSAIDs. Plain films of C, T, and L spine at that time showed spondylosis at multiple levels, no acute problems.Since that time she has seen her neurosurgeon for musculoskeletal cervical, thoracic, and lumbar spine pain/strain. She recently got a C and L spine MRI with Dr. 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 See discussion above. There is progression of disease from 2013. No definite abnormal cord signal. These changes are most severe at C4-5, where the canal diameter approaches 5 mm on the LEFT.  L-spine MRI 05/09/17: IMPRESSION: The dominant abnormality is at L4-5, where large disc extrusion extends into the RIGHT neural foramen and extraforaminal compartment. RIGHT L4 nerve root impingement is  present. Smaller foraminal protrusion at L3-4 on the LEFT. LEFT L3 nerve root impingement is likely.  Past Medical History:  Diagnosis Date  . Anxiety   . Cervical spondylosis   . Compression fracture of thoracic vertebra (HCC)    Mild; T12  . Degenerative disc disease   . Depression    Situational  . Eclampsia    Seizure 1 wk after delivery of 3rd child.  Marland Kitchen GERD (gastroesophageal reflux disease)   . Hemorrhoids   . History of chronic sinusitis 2012  . History of vitamin D deficiency    Pt says she took replacement dosing and then daily maintenance but no vit D level was rechecked  . Hx of gestational diabetes mellitus, not currently pregnant   . Hx of parotitis 10/2016   Left sided: dx'd at ED in [facility], rx'd keflex and flagyl and recovered uneventfully.  . Lumbar spondylosis   . Migraine    Quiescent since her mid 29s.  Resurgence 2018  . Ruptured cervical disc   . 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 Lovell Sheehan.    Past Surgical History:  Procedure Laterality Date  . CHOLECYSTECTOMY N/A 07/05/2015   Procedure: LAPAROSCOPIC CHOLECYSTECTOMY WITH INTRAOPERATIVE CHOLANGIOGRAM;  Surgeon: Manus Rudd, MD;  Location: [facility] OR;  Service: General;  Laterality: N/A;  . DILATION AND EVACUATION  1999  . WISDOM TOOTH EXTRACTION  Outpatient Medications Prior to Visit  Medication Sig Dispense Refill  . cholecalciferol (VITAMIN D) 1000 units tablet Take 3,000 Units by mouth daily.    . citalopram (CELEXA) 40 MG tablet Take 1 tablet (40 mg total) by mouth daily. 30 tablet 7  . fluticasone (FLONASE) 50 MCG/ACT nasal spray Place 2 sprays into both nostrils daily. 16 g 12  . levonorgestrel (MIRENA) 20 MCG/24HR IUD 1 each by Intrauterine route once.    . meloxicam (MOBIC) 15 MG tablet 1 tap po qd prn--take with food 30 tablet 2  . metaxalone (SKELAXIN) 800 MG tablet Take  1 tablet (800 mg total) by mouth 4 (four) times daily as needed. 120 tablet 2  . ondansetron (ZOFRAN) 8 MG tablet Take 1 tablet (8 mg total) by mouth every 8 (eight) hours as needed for nausea or vomiting. 30 tablet 0  . oxyCODONE (OXY IR/ROXICODONE) 5 MG immediate release tablet Take 1-2 tablets every 6 hours as needed    . rizatriptan (MAXALT) 10 MG tablet Take 1 tablet (10 mg total) by mouth as needed for migraine. May repeat in 2 hours if needed 10 tablet 6   No facility-administered medications prior to visit.     Allergies  Allergen Reactions  . Penicillins Rash    Has patient had a PCN reaction causing immediate rash, facial/tongue/throat swelling, SOB or lightheadedness with hypotension: unknown, childhood reaction    ROS As per HPI  PE: Blood pressure 122/80, pulse 95, temperature 98 F (36.7 C), temperature source Oral, height 5\' 7"  (1.702 m), weight 244 lb 12.8 oz (111 kg), SpO2 96 %. Gen: Alert, well appearing.  Patient is oriented to person, place, time, and situation. ZOX:WRUE: no injection, icteris, swelling, or exudate.  EOMI, PERRLA. Mouth: lips without lesion/swelling.  Oral mucosa pink and moist. Oropharynx without erythema, exudate, or swelling.  Neck - No masses or thyromegaly or limitation in range of motion CV: RRR, no m/r/g.   LUNGS: CTA bilat, nonlabored resps, good aeration in all lung fields. ABD: soft, rotund but non-distended (but she reports hx of abdomen being distended with this illness), TTP in upper abdomen diffusely.  BS nl.  No mass, HSM, or bruit. EXT: no clubbing or cyanosis.  She has 2+ pitting edema in both ankles.  LABS:    Chemistry      Component Value Date/Time   NA 136 04/30/2017 1356   K 4.2 04/30/2017 1356   CL 102 04/30/2017 1356   CO2 29 04/30/2017 1356   BUN 10 04/30/2017 1356   CREATININE 0.85 04/30/2017 1356      Component Value Date/Time   CALCIUM 8.9 04/30/2017 1356   ALKPHOS 75 04/30/2017 1356   AST 11 04/30/2017 1356    ALT 14 04/30/2017 1356   BILITOT 0.4 04/30/2017 1356     Lab Results  Component Value Date   WBC 13.1 (H) 04/30/2017   HGB 14.8 04/30/2017   HCT 45.4 04/30/2017   MCV 85.5 04/30/2017   PLT 287.0 04/30/2017     IMPRESSION AND PLAN:  Subacute upper abdominal pain, mild/low grade nausea w/out vomiting. She had a recent CT abd/pelv w/constrast that showed some small scattered LN's--nonspecific. WBC was slightly increased at that time as well. Sx's actually pretty consistent with acute gastritis.  Symptoms complicated lately by constipation. Will start pantoprazole 40mg  qd and otc generic zantac 150mg  qhs. Check CBC, CMET, lipase, and H pylori IgG. Also check acute abdominal x-ray series.  An After Visit Summary was printed and  given to the patient.  FOLLOW UP: 10 days  Signed:  Santiago BumpersPhil Dominga Mcduffie, MD           05/15/2017

## 2017-05-15 ENCOUNTER — Encounter: Payer: Self-pay | Admitting: Family Medicine

## 2017-05-15 ENCOUNTER — Other Ambulatory Visit: Payer: Self-pay | Admitting: Family Medicine

## 2017-05-15 ENCOUNTER — Other Ambulatory Visit (INDEPENDENT_AMBULATORY_CARE_PROVIDER_SITE_OTHER): Payer: Commercial Managed Care - PPO

## 2017-05-15 DIAGNOSIS — R739 Hyperglycemia, unspecified: Secondary | ICD-10-CM

## 2017-05-15 LAB — HEMOGLOBIN A1C: Hgb A1c MFr Bld: 7.6 % — ABNORMAL HIGH (ref 4.6–6.5)

## 2017-05-15 LAB — H. PYLORI ANTIBODY, IGG: H Pylori IgG: POSITIVE — AB

## 2017-05-15 MED ORDER — TETRACYCLINE HCL 500 MG PO CAPS: ORAL_CAPSULE | ORAL | 0 refills | Status: DC

## 2017-05-15 MED ORDER — METRONIDAZOLE 500 MG PO TABS: ORAL_TABLET | ORAL | 0 refills | Status: DC

## 2017-05-19 ENCOUNTER — Telehealth: Payer: Self-pay | Admitting: Family Medicine

## 2017-05-19 NOTE — Telephone Encounter (Signed)
Patient is calling and stating she has been needing a prior authorization since last week for Protonix. [facility] wanted patient to call and see if it is something that she can take over the counter and if so what mg. Please advise and contact patient.

## 2017-05-19 NOTE — Telephone Encounter (Signed)
Copied from CRM #[id number]. Topic: Quick Communication - See Telephone Encounter >> May 19, 2017  4:43 PM Windy KalataMichael, Latoya Maulding L, NT wrote: CRM for notification. See Telephone encounter for:  05/19/17.  Patient is calling and stating she has been needing a prior authorization since last week for Protonix. [facility] pharmacy wanted patient to call and see if it is something that she can take over the counter and if so what mg. Please advise and contact patient.

## 2017-05-20 NOTE — Telephone Encounter (Signed)
Patient notified via voice mail that the prior authorization for protonix 40mg  was denied. Her insurance does not cover this medication due to it being OTC. Patient can obtain this medication at Pharmacy OTC and also in generic form. Patient notified also of the strength being Protonix 40mg  and to return call if any questions. Okay per DPR.

## 2017-05-25 ENCOUNTER — Ambulatory Visit: Payer: Commercial Managed Care - PPO | Admitting: Family Medicine

## 2017-05-25 ENCOUNTER — Encounter: Payer: Self-pay | Admitting: Family Medicine

## 2017-05-25 VITALS — BP 127/85 | HR 101 | Temp 98.3°F | Resp 16 | Ht 67.0 in | Wt 241.5 lb

## 2017-05-25 DIAGNOSIS — M549 Dorsalgia, unspecified: Secondary | ICD-10-CM | POA: Diagnosis not present

## 2017-05-25 DIAGNOSIS — B9681 Helicobacter pylori [H. pylori] as the cause of diseases classified elsewhere: Secondary | ICD-10-CM

## 2017-05-25 DIAGNOSIS — K2970 Gastritis, unspecified, without bleeding: Secondary | ICD-10-CM | POA: Diagnosis not present

## 2017-05-25 MED ORDER — LORAZEPAM 0.5 MG PO TABS: ORAL_TABLET | ORAL | 1 refills | Status: DC

## 2017-05-25 NOTE — Patient Instructions (Signed)
Try over the counter lidocaine patches: use as directed on the packaging.

## 2017-05-25 NOTE — Progress Notes (Signed)
OFFICE VISIT  05/26/2017   CC:  Chief Complaint  Patient presents with  . Back Pain   HPI:    Patient is a 37 y.o. Caucasian female who presents for 10 day f/u acute gastritis sx's with the presence of + H pylori IgG. I started her on flagyl, tetracycline, and pepto and pantoprazole at that time. Moderate colonic stool burden noted on acute abd series at that time---no acute problems.  We discussed gentle use of miralax or senna.  Unclear whether or not this was contributing to her sx's or not.  Says stomach is almost completely back to normal.  Minimal discomfort, no nausea.  No distention anymore.  Hx of chronic neck and back pain.  Says yesterday she was just standing still and noted a severe pain in lower T spine level in midline. Feels some odd temperature sensation in same area on/off recently.  No trauma or preceding strain.  She was referred to PT by  Her NS for her L spine with radiculopathy--she is in the process of getting this set up. She is taking skelaxin.  She took 2 vicodin rx'd by her NS yesterday.  She did not apply any heat to the area.   Past Medical History:  Diagnosis Date  . Anxiety   . Cervical spondylosis   . Compression fracture of thoracic vertebra (HCC)    Mild; T12  . Degenerative disc disease    Cervical, thoracic, lumbar  . Depression    Situational  . DM (diabetes mellitus), type 2 (HCC) 05/2017   new dx 05/14/17---nonfasting gluc >200, HbA1c 7.6%.  . Eclampsia    Seizure 1 wk after delivery of 3rd child.  Marland Kitchen GERD (gastroesophageal reflux disease)   . Helicobacter pylori gastritis 05/2017   Dx'd by clinical presentation + H pylori IgG positive.  . Hemorrhoids   . History of chronic sinusitis 2012  . History of vitamin D deficiency    Pt says she took replacement dosing and then daily maintenance but no vit D level was rechecked  . Hx of gestational diabetes mellitus, not currently pregnant   . Hx of parotitis 10/2016   Left sided: dx'd at  ED in [facility], rx'd keflex and flagyl and recovered uneventfully.  . Lumbar spondylosis   . Migraine    Quiescent since her mid 91s.  Resurgence 2018  . Ruptured cervical disc   . 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 Lovell Sheehan.    Past Surgical History:  Procedure Laterality Date  . CHOLECYSTECTOMY N/A 07/05/2015   Procedure: LAPAROSCOPIC CHOLECYSTECTOMY WITH INTRAOPERATIVE CHOLANGIOGRAM;  Surgeon: Manus Rudd, MD;  Location: [facility] OR;  Service: General;  Laterality: N/A;  . dexa  05/2017   Normal  . DILATION AND EVACUATION  1999  . WISDOM TOOTH EXTRACTION      Outpatient Medications Prior to Visit  Medication Sig Dispense Refill  . cholecalciferol (VITAMIN D) 1000 units tablet Take 3,000 Units by mouth daily.    . citalopram (CELEXA) 40 MG tablet Take 1 tablet (40 mg total) by mouth daily. 30 tablet 7  . fluticasone (FLONASE) 50 MCG/ACT nasal spray Place 2 sprays into both nostrils daily. 16 g 12  . HYDROcodone-acetaminophen (NORCO/VICODIN) 5-325 MG tablet Take 1-2 tablets by mouth every 6 (six) hours as needed.     Marland Kitchen levonorgestrel (MIRENA) 20 MCG/24HR IUD 1 each by Intrauterine route once.    Marland Kitchen  metaxalone (SKELAXIN) 800 MG tablet Take 1 tablet (800 mg total) by mouth 4 (four) times daily as needed. 120 tablet 2  . metroNIDAZOLE (FLAGYL) 500 MG tablet 1 tab po tid x 14d 42 tablet 0  . omeprazole (PRILOSEC OTC) 20 MG tablet Take 40 mg by mouth daily.    . ondansetron (ZOFRAN) 8 MG tablet Take 1 tablet (8 mg total) by mouth every 8 (eight) hours as needed for nausea or vomiting. 30 tablet 0  . oxyCODONE (OXY IR/ROXICODONE) 5 MG immediate release tablet Take 1-2 tablets every 6 hours as needed    . rizatriptan (MAXALT) 10 MG tablet Take 1 tablet (10 mg total) by mouth as needed for migraine. May repeat in 2 hours if needed 10 tablet 6  . tetracycline (ACHROMYCIN,SUMYCIN)  500 MG capsule 1 tab po qid x 14d 56 capsule 0  . meloxicam (MOBIC) 15 MG tablet 1 tap po qd prn--take with food (Patient not taking: Reported on 05/25/2017) 30 tablet 2  . pantoprazole (PROTONIX) 40 MG tablet Take 1 tablet (40 mg total) by mouth daily. (Patient not taking: Reported on 05/25/2017) 30 tablet 1   No facility-administered medications prior to visit.     Allergies  Allergen Reactions  . Penicillins Rash    Has patient had a PCN reaction causing immediate rash, facial/tongue/throat swelling, SOB or lightheadedness with hypotension: unknown, childhood reaction    ROS As per HPI  PE: Blood pressure 127/85, pulse (!) 101, temperature 98.3 F (36.8 C), temperature source Oral, resp. rate 16, height 5\' 7"  (1.702 m), weight 241 lb 8 oz (109.5 kg), SpO2 97 %. Body mass index is 37.82 kg/m.  Gen: Alert, well appearing.  Patient is oriented to person, place, time, and situation. AFFECT: pleasant, lucid thought and speech.  However, she begins to cry when talking about how her back is affecting her independence and may lead to problems with her new job. Back: no midline TTP, no step off, no paraspinous soft tissue or facet joint tenderness.  No rash. ROM induces some pulling in mid-lower T spine.   LABS:    Chemistry      Component Value Date/Time   NA 137 05/14/2017 1416   K 4.0 05/14/2017 1416   CL 100 05/14/2017 1416   CO2 30 05/14/2017 1416   BUN 5 (L) 05/14/2017 1416   CREATININE 0.82 05/14/2017 1416      Component Value Date/Time   CALCIUM 9.0 05/14/2017 1416   ALKPHOS 73 05/14/2017 1416   AST 12 05/14/2017 1416   ALT 17 05/14/2017 1416   BILITOT 0.4 05/14/2017 1416     Lab Results  Component Value Date   WBC 11.5 (H) 05/14/2017   HGB 14.0 05/14/2017   HCT 42.5 05/14/2017   MCV 85.2 05/14/2017   PLT 267.0 05/14/2017   Lab Results  Component Value Date   LIPASE 27.0 05/14/2017   H pylori IgG POSITIVE 05/14/17.   IMPRESSION AND PLAN:  1) Acute H  pylori gastritis: MUCH improved on treatment regimen. Finish abx, pepto, PPI as instructed.  2) Acute on chronic back pain: I believe she has musculoskeletal pain (myofascial vs some facet joint pain). Facet jt pain or spondylolisthesis/spondylolysis much less likley given that the pain occurred w/out any provocation AND she is non-tender in back today. Continue current pain meds, muscle relaxer, and add otc 4% lidocaine patches. Additionally, start ativan 0.5mg , 1-2 q8 h prn. Therapeutic expectations and side effect profile of medication discussed today.  Patient's  questions answered.  She continue with plan for PT for Lumbar DDD with radiculopathy as per her NS orders.  An After Visit Summary was printed and given to the patient.  FOLLOW UP: Return in about 3 months (around 08/23/2017) for routine chronic illness f/u.  Signed:  Santiago Bumpers, MD           05/26/2017

## 2017-06-30 ENCOUNTER — Ambulatory Visit: Payer: Commercial Managed Care - PPO | Admitting: Family Medicine

## 2017-06-30 ENCOUNTER — Encounter: Payer: Self-pay | Admitting: Family Medicine

## 2017-06-30 ENCOUNTER — Ambulatory Visit: Payer: Self-pay | Admitting: Family Medicine

## 2017-06-30 VITALS — BP 128/74 | HR 105 | Temp 98.2°F | Ht 67.0 in | Wt 239.8 lb

## 2017-06-30 DIAGNOSIS — J0191 Acute recurrent sinusitis, unspecified: Secondary | ICD-10-CM

## 2017-06-30 DIAGNOSIS — R05 Cough: Secondary | ICD-10-CM

## 2017-06-30 DIAGNOSIS — K2970 Gastritis, unspecified, without bleeding: Secondary | ICD-10-CM | POA: Diagnosis not present

## 2017-06-30 DIAGNOSIS — B9681 Helicobacter pylori [H. pylori] as the cause of diseases classified elsewhere: Secondary | ICD-10-CM | POA: Diagnosis not present

## 2017-06-30 DIAGNOSIS — R059 Cough, unspecified: Secondary | ICD-10-CM

## 2017-06-30 MED ORDER — GUAIFENESIN-CODEINE 100-10 MG/5ML PO SOLN: 5.0000 mL | Freq: Three times a day (TID) | ORAL | 0 refills | Status: DC | PRN

## 2017-06-30 MED ORDER — DOXYCYCLINE HYCLATE 100 MG PO TABS: 100.0000 mg | ORAL_TABLET | Freq: Two times a day (BID) | ORAL | 0 refills | Status: DC

## 2017-06-30 MED ORDER — IPRATROPIUM BROMIDE 0.06 % NA SOLN: 2.0000 | Freq: Four times a day (QID) | NASAL | 0 refills | Status: DC

## 2017-06-30 MED ORDER — METHYLPREDNISOLONE ACETATE 80 MG/ML IJ SUSP
80.0000 mg | Freq: Once | INTRAMUSCULAR | Status: AC
  Administered 2017-06-30: 80 mg via INTRAMUSCULAR

## 2017-06-30 NOTE — Progress Notes (Signed)
    Subjective:  Jenna Taylor is a 37 y.o. female who presents today for same-day appointment with a chief complaint of cough.   HPI:  Cough, Acute Issue Started 10 days ago.  Was improving for several days, however significantly worsened over the last 2-3 days.  She initially had some fever which has since resolved.  Current associated symptoms include rhinorrhea, sore throat, facial pressure, and ear pressure.  Cough is much worse at night.  She has tried taking TheraFlu which is helped a little bit.  No current fevers or chills.  No shortness of breath.  No other obvious alleviating or aggravating factors.  ROS: Per HPI  PMH: She reports that she quit smoking about 13 months ago. Her smoking use included cigarettes. She smoked 0.50 packs per day. she has never used smokeless tobacco. She reports that she drinks alcohol. She reports that she does not use drugs.  Objective:  Physical Exam: BP 128/74 (BP Location: Left Arm, Patient Position: Sitting, Cuff Size: Large)   Pulse (!) 105   Temp 98.2 F (36.8 C) (Oral)   Ht 5\' 7"  (1.702 m)   Wt 239 lb 12.8 oz (108.8 kg)   SpO2 98%   BMI 37.56 kg/m   Gen: NAD, resting comfortably HEENT: TMs with clear effusion bilaterally.  Oropharynx slightly erythematous without exudate.  Nasal mucosa erythematous and boggy bilaterally with thick, white discharge.  Maxillary sinuses with mildly decreased transillumination bilaterally.  No lymphadenopathy. CV: RRR with no murmurs appreciated Pulm: NWOB, CTAB with no crackles, wheezes, or rhonchi  Assessment/Plan:  Cough Concern for bacterial secondary infection to her recent flulike illness given that her symptoms recently worsened after a period of improving.  We will start doxycycline 100 mg twice daily for 7-day course.  Will give 80mg  IM depomedrol for sore throat and sinusitis. Start atrovent for rhinorrhea/sinus congestion. Recommended tylenol and/or motrin as needed for low grade fever and  pain. Encouraged good oral hydration - . Return precautions reviewed. Follow up as needed.   H Pylori Gastritis Pt finished course of abx. Advised continued use of acid suppression until follows up with PCP.   Katina Degreealeb M. Jimmey RalphParker, MD 06/30/2017 12:15 PM

## 2017-06-30 NOTE — Patient Instructions (Signed)
Start the atrovent and doxycycline.   Start cough syrup for your cough.  Please stay well hydrated.  You can take tylenol and/or motrin as needed for low grade fever and pain.  Please let me know if your symptoms worsen or fail to improve.  Take care, Dr Suzie Vandam  

## 2017-11-05 IMAGING — DX DG LUMBAR SPINE COMPLETE 4+V
5 series · 5 of 5 positions shown · non-contrast
Comparison: Rib series 08/08/2016. Abdominal series [DATE] 11/22/2015.
07/30/2015.

CLINICAL DATA: Chronic pain.  History of degenerative disease.

EXAM:
LUMBAR SPINE - COMPLETE 4+ VIEW

[l-spine ap]
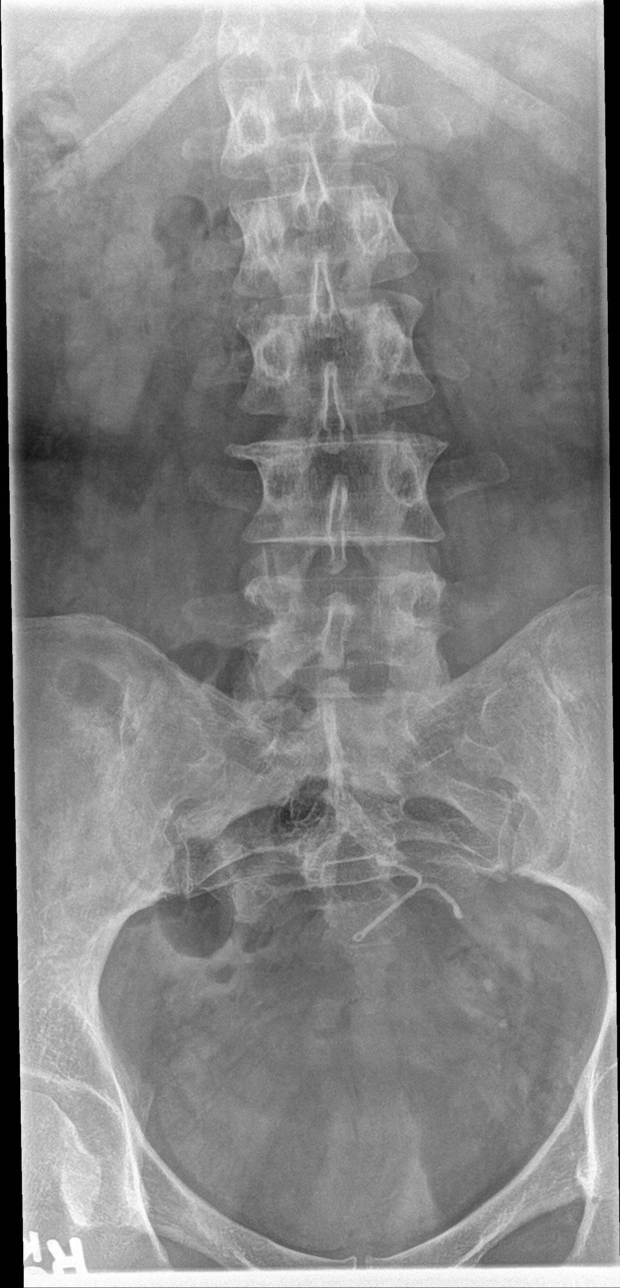

[l-spine obl (1 of 2)]
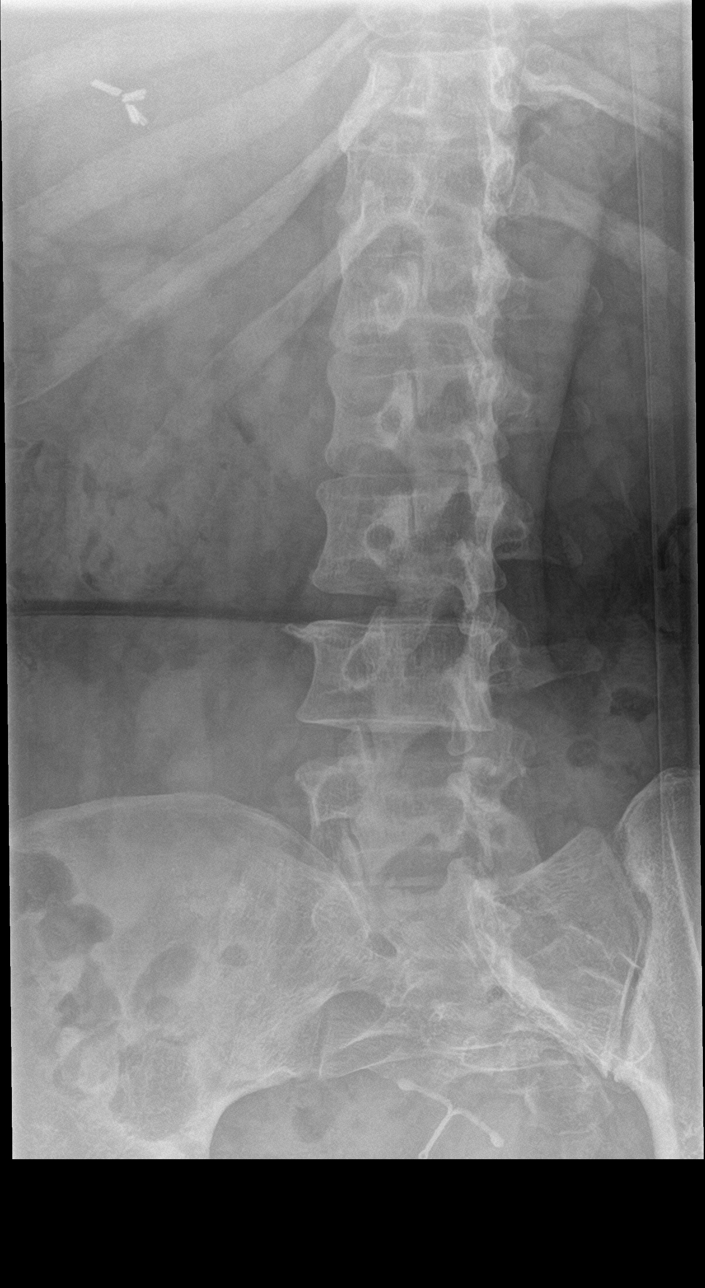

[l-spine obl (2 of 2)]
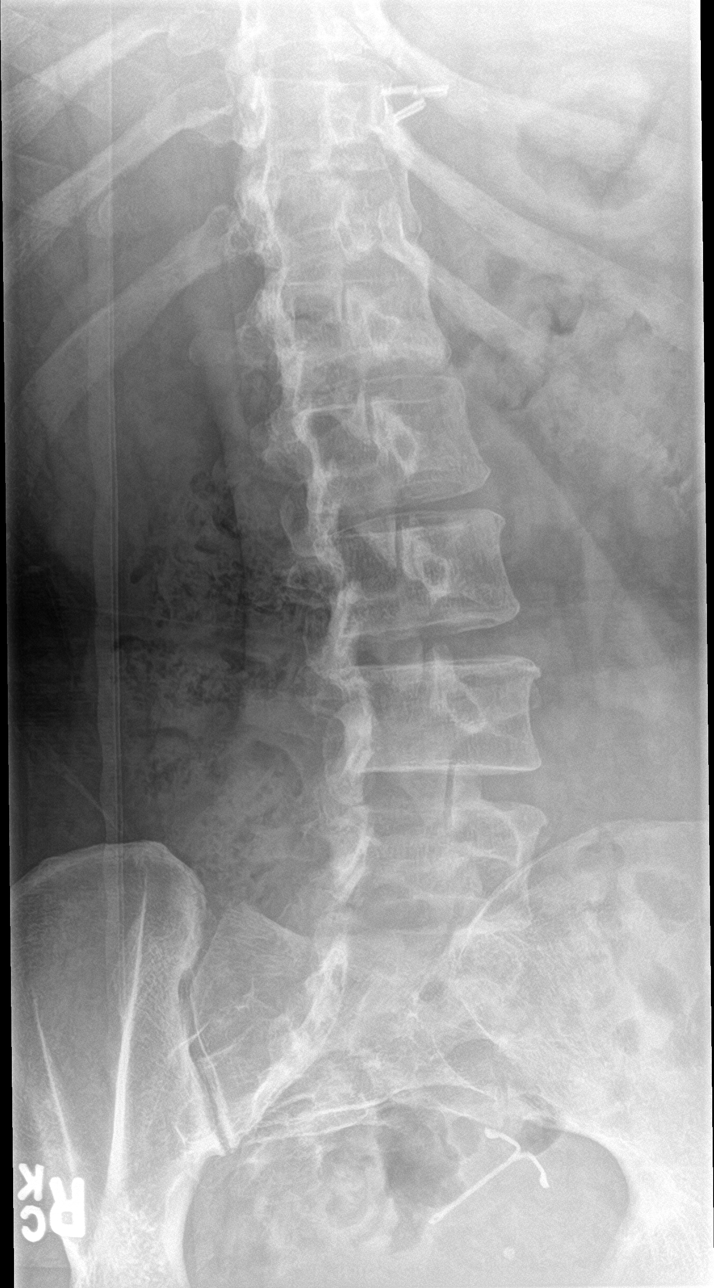

[l-spine lat]
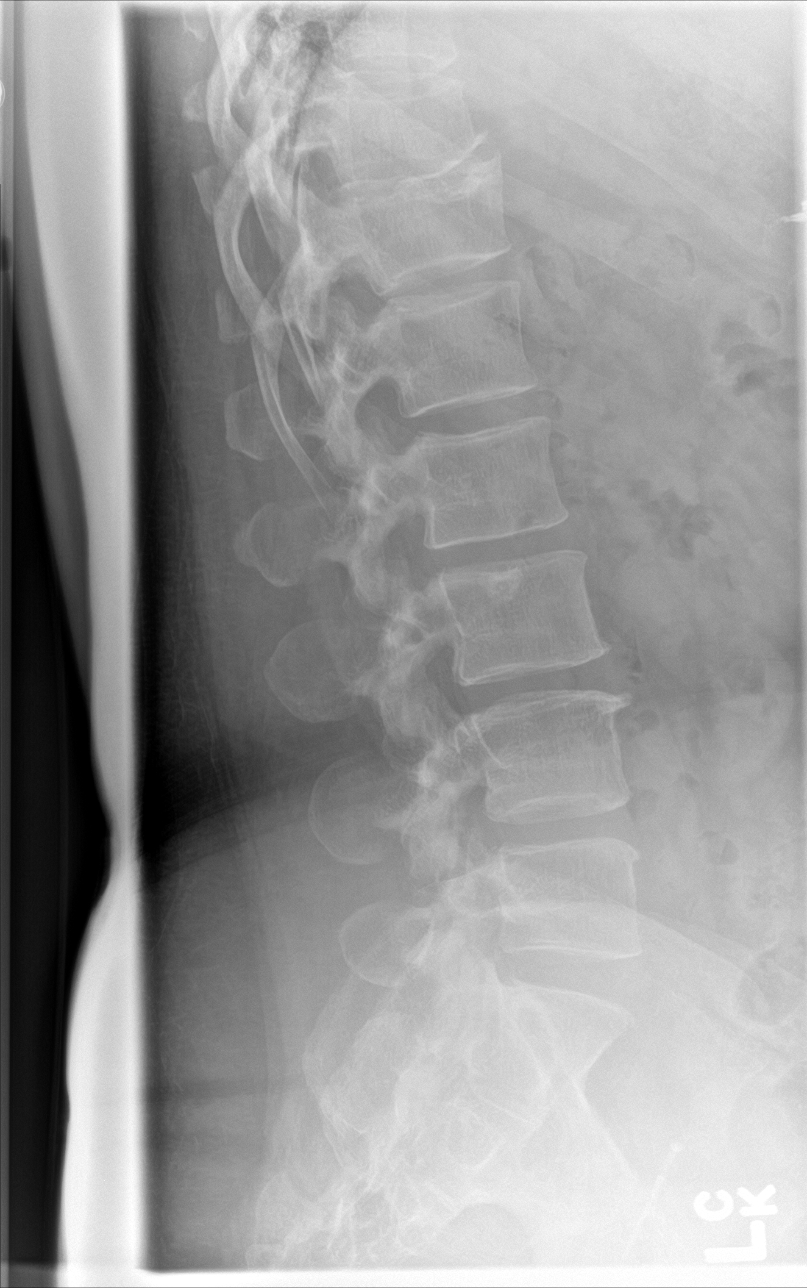

[l-spine spot]
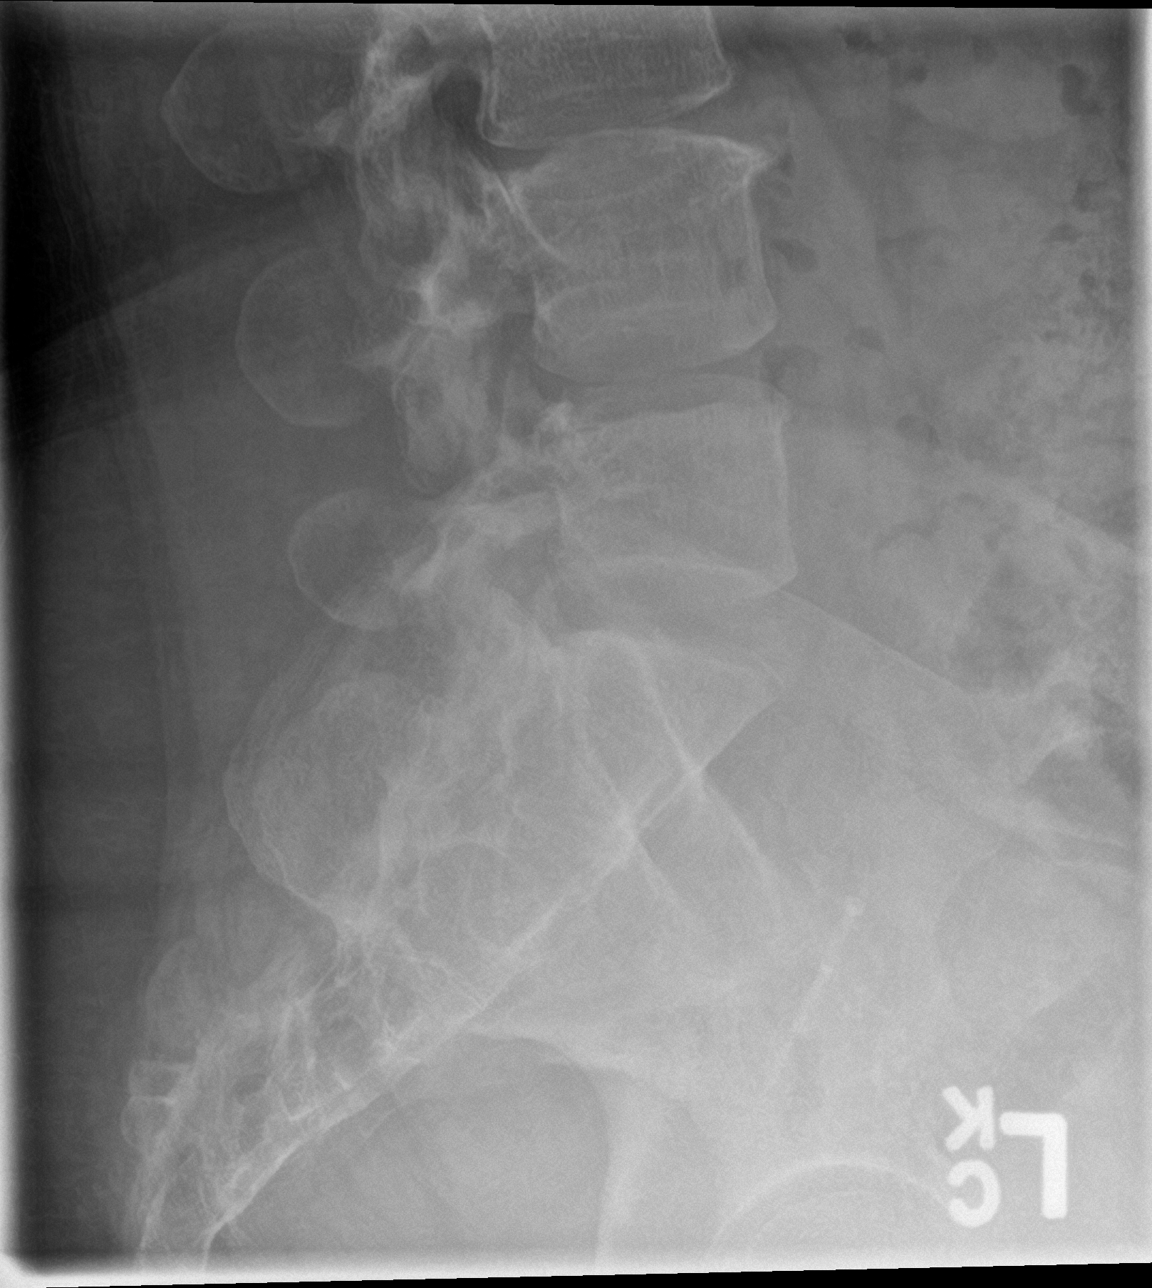

[5 of 5 positions shown; findings below may reference images not displayed]

FINDINGS: Lumbar vertebra numbered with the last ribbed vertebra as T12. Mild
lumbar spine scoliosis. Diffuse multilevel degenerative change.
Stable mild T12 compression fracture. Pelvic calcifications
consistent phleboliths. IUD noted the pelvis.
IMPRESSION: 1. Mild lumbar scoliosis concave right. Diffuse osteopenia
degenerative change.

2. Mild stable T12 compression fracture. No acute abnormality
identified.

## 2017-11-05 IMAGING — DX DG THORACIC SPINE 4+V
2 series · 2 of 2 positions shown · non-contrast
Comparison: MRI 07/17/2013.

CLINICAL DATA: Chronic pain.  History of degenerative disease.

EXAM:
THORACIC SPINE - 4+ VIEW

[t-spine ap]
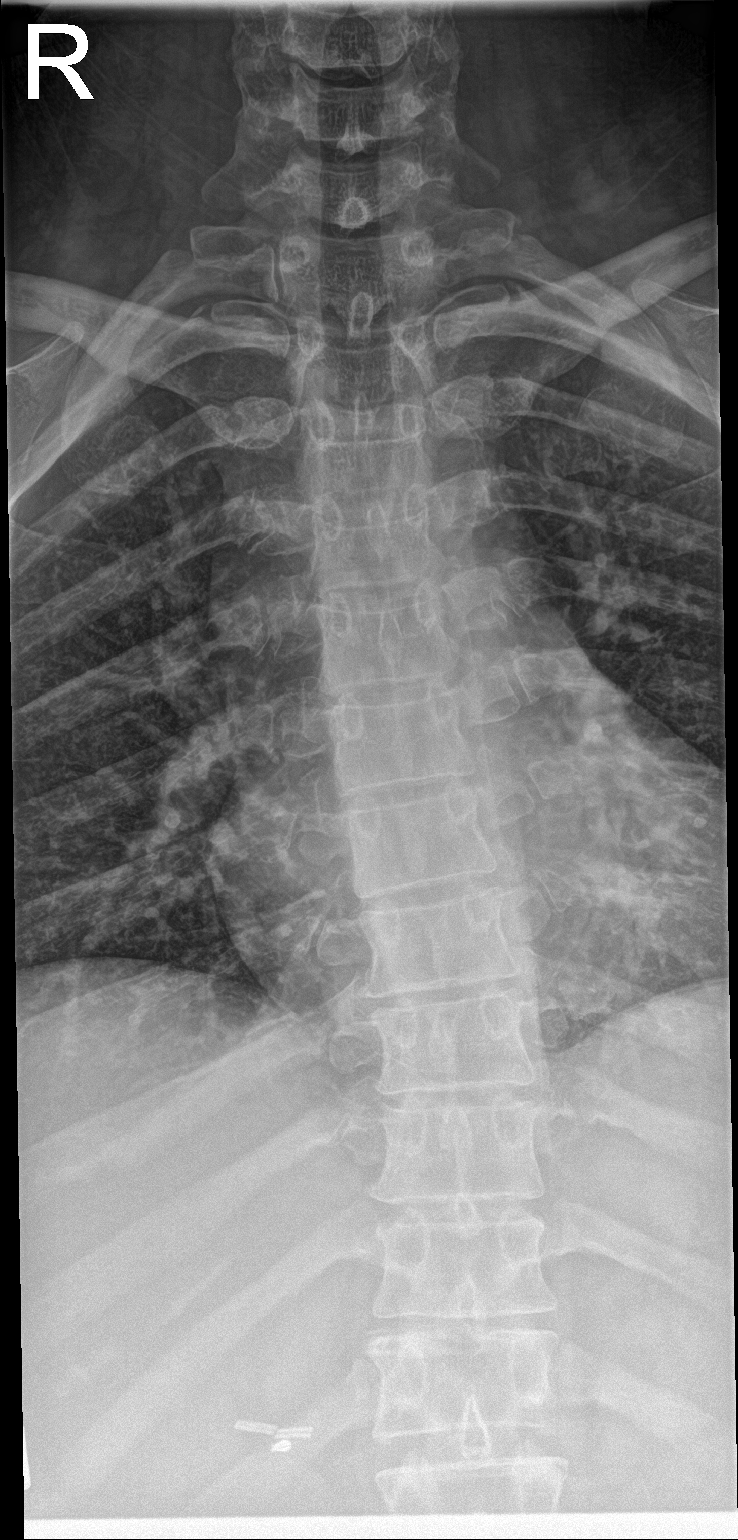

[t-spine lat]
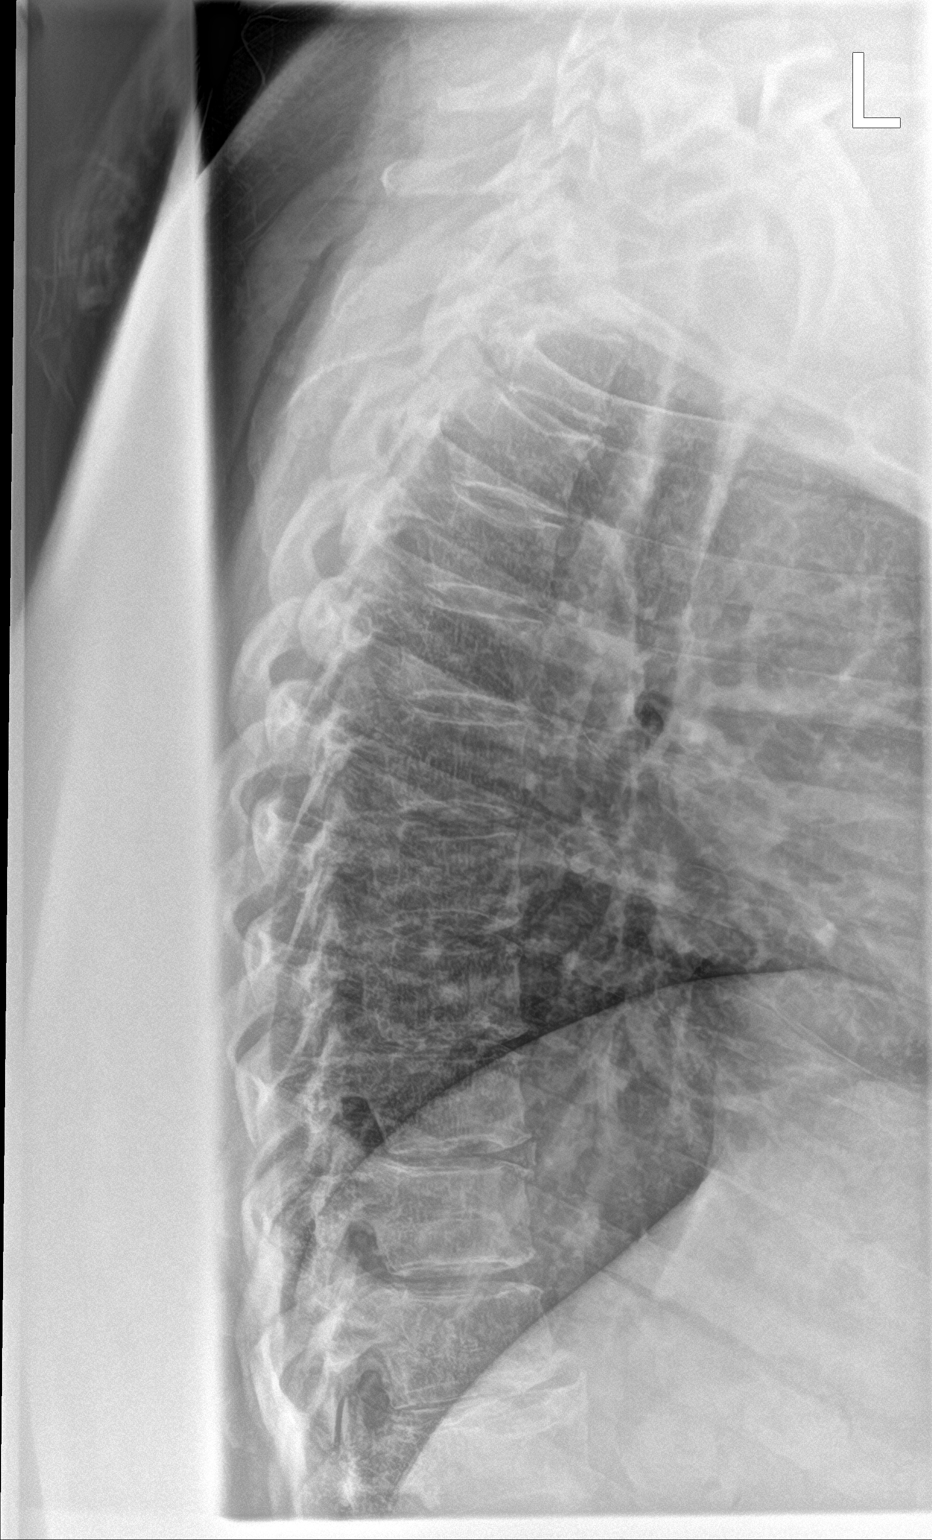

[2 of 2 positions shown; findings below may reference images not displayed]

FINDINGS: Thoracic vertebrae are numbered with T12 being the last ribbed
vertebra. S shaped scoliosis thoracic spine. Diffuse osteopenia and
multilevel degenerative change. Mild T12 compression fracture. No
change from 08/08/2016.
IMPRESSION: 1. S shaped thoracic spine scoliosis. Diffuse osteopenia multilevel
degenerative change .

2.  Mild T12 compression fracture.  No change from 08/08/2016.

## 2018-01-13 ENCOUNTER — Ambulatory Visit: Payer: BLUE CROSS/BLUE SHIELD | Admitting: Family Medicine

## 2018-01-13 ENCOUNTER — Encounter: Payer: Self-pay | Admitting: Family Medicine

## 2018-01-13 VITALS — BP 122/82 | HR 94 | Temp 98.2°F | Resp 16 | Ht 67.0 in | Wt 240.0 lb

## 2018-01-13 DIAGNOSIS — E119 Type 2 diabetes mellitus without complications: Secondary | ICD-10-CM

## 2018-01-13 DIAGNOSIS — R2232 Localized swelling, mass and lump, left upper limb: Secondary | ICD-10-CM | POA: Diagnosis not present

## 2018-01-13 LAB — CBC WITH DIFFERENTIAL/PLATELET
BASOS ABS: 0.1 10*3/uL (ref 0.0–0.1)
Basophils Relative: 1.1 % (ref 0.0–3.0)
Eosinophils Absolute: 0.4 10*3/uL (ref 0.0–0.7)
Eosinophils Relative: 4.2 % (ref 0.0–5.0)
HEMATOCRIT: 45.7 % (ref 36.0–46.0)
Hemoglobin: 15.2 g/dL — ABNORMAL HIGH (ref 12.0–15.0)
LYMPHS ABS: 2.1 10*3/uL (ref 0.7–4.0)
Lymphocytes Relative: 22.7 % (ref 12.0–46.0)
MCHC: 33.3 g/dL (ref 30.0–36.0)
MCV: 85 fl (ref 78.0–100.0)
MONOS PCT: 4.7 % (ref 3.0–12.0)
Monocytes Absolute: 0.4 10*3/uL (ref 0.1–1.0)
NEUTROS PCT: 67.3 % (ref 43.0–77.0)
Neutro Abs: 6.1 10*3/uL (ref 1.4–7.7)
PLATELETS: 288 10*3/uL (ref 150.0–400.0)
RBC: 5.37 Mil/uL — AB (ref 3.87–5.11)
RDW: 13.6 % (ref 11.5–15.5)
WBC: 9.1 10*3/uL (ref 4.0–10.5)

## 2018-01-13 LAB — COMPREHENSIVE METABOLIC PANEL
ALBUMIN: 4.2 g/dL (ref 3.5–5.2)
ALT: 20 U/L (ref 0–35)
AST: 16 U/L (ref 0–37)
Alkaline Phosphatase: 77 U/L (ref 39–117)
BUN: 9 mg/dL (ref 6–23)
CALCIUM: 9.8 mg/dL (ref 8.4–10.5)
CHLORIDE: 100 meq/L (ref 96–112)
CO2: 30 meq/L (ref 19–32)
Creatinine, Ser: 0.89 mg/dL (ref 0.40–1.20)
GFR: 75.1 mL/min (ref >60.00)
Glucose, Bld: 118 mg/dL — ABNORMAL HIGH (ref 70–99)
Potassium: 4.3 mEq/L (ref 3.5–5.1)
Sodium: 138 mEq/L (ref 135–145)
Total Bilirubin: 0.5 mg/dL (ref 0.2–1.2)
Total Protein: 7.3 g/dL (ref 6.0–8.3)

## 2018-01-13 LAB — HEMOGLOBIN A1C: Hgb A1c MFr Bld: 7.3 % — ABNORMAL HIGH (ref 4.6–6.5)

## 2018-01-13 NOTE — Progress Notes (Signed)
OFFICE VISIT  01/13/2018   CC:  Chief Complaint  Patient presents with  . Mass    left axillary   . Dizziness    HPI:    Patient is a 37 y.o. Caucasian female who presents for complaint of pain in left axilla. Hurting and swollen in L armpit noted yesterday, says she has had some mild discomfort in the area for last couple months. No drainage.  No hx of cyst/abscess in this location or any other location in the past.  No fevers. Generalized HA last couple days.  Some mental fogginess and lightheaded feeling with HAs. Takes tylenol regularly for back.  No NSAIDs.  Took rizatriptan last night and it helped some. Currently denies HA.  She is fasting right now.  Had some blurry vision on 1-2 brief occasions since last visit.   Of note, we dx'd her with new dx DM 2 when her random gluc here was >200 and A1c was 7.6% back in Jan this year.  She has not followed up since then. She denies polyuria or polydipsia.   She states she avoids sweets.    ROS: no CP, no SOB, no wheezing, no cough,  no rashes, no melena/hematochezia.  No wt loss.  Past Medical History:  Diagnosis Date  . Anxiety   . Cervical spondylosis   . Compression fracture of thoracic vertebra (HCC)    Mild; T12  . Degenerative disc disease    Cervical, thoracic, lumbar  . Depression    Situational  . DM (diabetes mellitus), type 2 (HCC) 05/2017   new dx 05/14/17---nonfasting gluc >200, HbA1c 7.6%.  . Eclampsia    Seizure 1 wk after delivery of 3rd child.  Marland Kitchen GERD (gastroesophageal reflux disease)   . Helicobacter pylori gastritis 05/2017   Dx'd by clinical presentation + H pylori IgG positive.  . Hemorrhoids   . History of chronic sinusitis 2012  . History of vitamin D deficiency    Pt says she took replacement dosing and then daily maintenance but no vit D level was rechecked  . Hx of gestational diabetes mellitus, not currently pregnant   . Hx of parotitis 10/2016   Left sided: dx'd at ED in [facility], rx'd  keflex and flagyl and recovered uneventfully.  . Lumbar spondylosis   . Migraine    Quiescent since her mid 17s.  Resurgence 2018  . Ruptured cervical disc   . 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 Lovell Sheehan.    Past Surgical History:  Procedure Laterality Date  . CHOLECYSTECTOMY N/A 07/05/2015   Procedure: LAPAROSCOPIC CHOLECYSTECTOMY WITH INTRAOPERATIVE CHOLANGIOGRAM;  Surgeon: Manus Rudd, MD;  Location: [facility] OR;  Service: General;  Laterality: N/A;  . dexa  05/2017   Normal  . DILATION AND EVACUATION  1999  . WISDOM TOOTH EXTRACTION      Outpatient Medications Prior to Visit  Medication Sig Dispense Refill  . cholecalciferol (VITAMIN D) 1000 units tablet Take 3,000 Units by mouth daily.    . citalopram (CELEXA) 40 MG tablet Take 1 tablet (40 mg total) by mouth daily. 30 tablet 7  . fluticasone (FLONASE) 50 MCG/ACT nasal spray Place 2 sprays into both nostrils daily. 16 g 12  . HYDROcodone-acetaminophen (NORCO/VICODIN) 5-325 MG tablet Take 1-2 tablets by mouth every 6 (six) hours as needed.     Marland Kitchen ipratropium (ATROVENT) 0.06 % nasal spray Place 2  sprays into both nostrils 4 (four) times daily. 15 mL 0  . levonorgestrel (MIRENA) 20 MCG/24HR IUD 1 each by Intrauterine route once.    Marland Kitchen LORazepam (ATIVAN) 0.5 MG tablet 1-2 tabs po tid prn anxiety 30 tablet 1  . meloxicam (MOBIC) 15 MG tablet 1 tap po qd prn--take with food 30 tablet 2  . metaxalone (SKELAXIN) 800 MG tablet Take 1 tablet (800 mg total) by mouth 4 (four) times daily as needed. 120 tablet 2  . omeprazole (PRILOSEC OTC) 20 MG tablet Take 40 mg by mouth daily.    . ondansetron (ZOFRAN) 8 MG tablet Take 1 tablet (8 mg total) by mouth every 8 (eight) hours as needed for nausea or vomiting. 30 tablet 0  . oxyCODONE (OXY IR/ROXICODONE) 5 MG immediate release tablet Take 1-2 tablets every 6 hours as needed    .  rizatriptan (MAXALT) 10 MG tablet Take 1 tablet (10 mg total) by mouth as needed for migraine. May repeat in 2 hours if needed 10 tablet 6  . doxycycline (VIBRA-TABS) 100 MG tablet Take 1 tablet (100 mg total) by mouth 2 (two) times daily. (Patient not taking: Reported on 01/13/2018) 14 tablet 0  . guaiFENesin-codeine 100-10 MG/5ML syrup Take 5 mLs by mouth 3 (three) times daily as needed for cough. (Patient not taking: Reported on 01/13/2018) 120 mL 0   No facility-administered medications prior to visit.     Allergies  Allergen Reactions  . Tramadol Other (See Comments)    Per pt - unable describe feeling, just not well  . Penicillins Rash    Has patient had a PCN reaction causing immediate rash, facial/tongue/throat swelling, SOB or lightheadedness with hypotension: unknown, childhood reaction    ROS As per HPI  PE: Blood pressure 122/82, pulse 94, temperature 98.2 F (36.8 C), temperature source Oral, resp. rate 16, height 5\' 7"  (1.702 m), weight 240 lb (108.9 kg), SpO2 97 %. Body mass index is 37.59 kg/m. Pt examined with Pryor Ochoa, CMA, as chaperone.  Gen: Alert, well appearing.  Patient is oriented to person, place, time, and situation. L axilla with mild but noticeable prominence of fatty tissue compared to R.  I cannot feel any nodule/mass in this region, just feels like fatty tissue. Breast exam: bilat w/out mass or suspicious nodule.  She has some ropy glandular breast tissue diffusely bilat.  Nipples normal, no d/c.  R axilla normal.  L axilla as above.  LABS:    Chemistry      Component Value Date/Time   NA 137 05/14/2017 1416   K 4.0 05/14/2017 1416   CL 100 05/14/2017 1416   CO2 30 05/14/2017 1416   BUN 5 (L) 05/14/2017 1416   CREATININE 0.82 05/14/2017 1416      Component Value Date/Time   CALCIUM 9.0 05/14/2017 1416   ALKPHOS 73 05/14/2017 1416   AST 12 05/14/2017 1416   ALT 17 05/14/2017 1416   BILITOT 0.4 05/14/2017 1416     Lab Results   Component Value Date   HGBA1C 7.6 (H) 05/15/2017    IMPRESSION AND PLAN:  1) Left axillary fullness-->? Nodule/mass.  Very tough to say based on exam.  Breast exam normal. Will further evaluate with left axillary ultrasound and mammogram.  2) DM 2, new dx about 9 mo ago. HbA1c and CMET repeat today.  Currently on no meds--was lost to f/u.  An After Visit Summary was printed and given to the patient.  FOLLOW UP: Return in about 1  week (around 01/20/2018) for f/u DM and L axilla.  Signed:  Santiago Bumpers, MD           01/13/2018

## 2018-01-14 ENCOUNTER — Other Ambulatory Visit: Payer: Self-pay | Admitting: *Deleted

## 2018-01-14 DIAGNOSIS — E119 Type 2 diabetes mellitus without complications: Secondary | ICD-10-CM

## 2018-01-14 MED ORDER — METFORMIN HCL 500 MG PO TABS: 500.0000 mg | ORAL_TABLET | Freq: Two times a day (BID) | ORAL | 2 refills | Status: DC

## 2018-01-25 ENCOUNTER — Ambulatory Visit: Payer: BLUE CROSS/BLUE SHIELD

## 2018-01-25 ENCOUNTER — Ambulatory Visit
Admission: RE | Admit: 2018-01-25 | Discharge: 2018-01-25 | Disposition: A | Discharge status: Home / Self Care | Payer: BLUE CROSS/BLUE SHIELD | Source: Ambulatory Visit | Attending: Family Medicine | Admitting: Family Medicine

## 2018-01-25 ENCOUNTER — Other Ambulatory Visit: Payer: BLUE CROSS/BLUE SHIELD

## 2018-01-25 DIAGNOSIS — R2232 Localized swelling, mass and lump, left upper limb: Secondary | ICD-10-CM

## 2018-01-26 ENCOUNTER — Encounter: Payer: Self-pay | Admitting: *Deleted

## 2018-01-28 ENCOUNTER — Ambulatory Visit (INDEPENDENT_AMBULATORY_CARE_PROVIDER_SITE_OTHER): Payer: BLUE CROSS/BLUE SHIELD | Admitting: Family Medicine

## 2018-01-28 ENCOUNTER — Encounter: Payer: Self-pay | Admitting: Family Medicine

## 2018-01-28 VITALS — BP 124/85 | HR 84 | Temp 98.1°F | Resp 16 | Ht 67.0 in | Wt 239.0 lb

## 2018-01-28 DIAGNOSIS — L02221 Furuncle of abdominal wall: Secondary | ICD-10-CM | POA: Diagnosis not present

## 2018-01-28 DIAGNOSIS — T148XXA Other injury of unspecified body region, initial encounter: Secondary | ICD-10-CM

## 2018-01-28 NOTE — Progress Notes (Signed)
OFFICE VISIT  01/28/2018   CC:  Chief Complaint  Patient presents with  . Boil?    on abdomen RLQ   HPI:    Patient is a 37 y.o. Caucasian female who presents for skin complaint. Noted "boil" about 6 d/a in RLQ abd wall, she squeezed it forcefully to pop it 2 nights ago, then she noted that it turned purplish red around it in the last 24h. It did drain some when she squeezed it and it has not enlarged any since she did this.  No f/c/malaise. No abd pain.  Past Medical History:  Diagnosis Date  . Anxiety   . Cervical spondylosis   . Compression fracture of thoracic vertebra (HCC)    Mild; T12  . Degenerative disc disease    Cervical, thoracic, lumbar  . Depression    Situational  . DM (diabetes mellitus), type 2 (HCC) 05/2017   new dx 05/14/17---nonfasting gluc >200, HbA1c 7.6%.  . Eclampsia    Seizure 1 wk after delivery of 3rd child.  Marland Kitchen GERD (gastroesophageal reflux disease)   . Helicobacter pylori gastritis 05/2017   Dx'd by clinical presentation + H pylori IgG positive.  . Hemorrhoids   . History of chronic sinusitis 2012  . History of vitamin D deficiency    Pt says she took replacement dosing and then daily maintenance but no vit D level was rechecked  . Hx of gestational diabetes mellitus, not currently pregnant   . Hx of parotitis 10/2016   Left sided: dx'd at ED in [facility], rx'd keflex and flagyl and recovered uneventfully.  . Lumbar spondylosis   . Migraine    Quiescent since her mid 52s.  Resurgence 2018  . Ruptured cervical disc   . 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 Lovell Sheehan.    Past Surgical History:  Procedure Laterality Date  . CHOLECYSTECTOMY N/A 07/05/2015   Procedure: LAPAROSCOPIC CHOLECYSTECTOMY WITH INTRAOPERATIVE CHOLANGIOGRAM;  Surgeon: Manus Rudd, MD;  Location: [facility] OR;  Service: General;  Laterality: N/A;  . dexa  05/2017    Normal  . DILATION AND EVACUATION  1999  . WISDOM TOOTH EXTRACTION      Outpatient Medications Prior to Visit  Medication Sig Dispense Refill  . cholecalciferol (VITAMIN D) 1000 units tablet Take 3,000 Units by mouth daily.    . citalopram (CELEXA) 40 MG tablet Take 1 tablet (40 mg total) by mouth daily. 30 tablet 7  . fluticasone (FLONASE) 50 MCG/ACT nasal spray Place 2 sprays into both nostrils daily. 16 g 12  . HYDROcodone-acetaminophen (NORCO/VICODIN) 5-325 MG tablet Take 1-2 tablets by mouth every 6 (six) hours as needed.     Marland Kitchen ipratropium (ATROVENT) 0.06 % nasal spray Place 2 sprays into both nostrils 4 (four) times daily. 15 mL 0  . levonorgestrel (MIRENA) 20 MCG/24HR IUD 1 each by Intrauterine route once.    Marland Kitchen LORazepam (ATIVAN) 0.5 MG tablet 1-2 tabs po tid prn anxiety 30 tablet 1  . meloxicam (MOBIC) 15 MG tablet 1 tap po qd prn--take with food 30 tablet 2  . metaxalone (SKELAXIN) 800 MG tablet Take 1 tablet (800 mg total) by mouth 4 (four) times daily as needed. 120 tablet 2  . metFORMIN (GLUCOPHAGE) 500 MG tablet Take 1 tablet (500 mg total) by mouth 2 (two) times daily with a meal. 60 tablet 2  . omeprazole (PRILOSEC OTC) 20  MG tablet Take 40 mg by mouth daily.    . ondansetron (ZOFRAN) 8 MG tablet Take 1 tablet (8 mg total) by mouth every 8 (eight) hours as needed for nausea or vomiting. 30 tablet 0  . oxyCODONE (OXY IR/ROXICODONE) 5 MG immediate release tablet Take 1-2 tablets every 6 hours as needed    . rizatriptan (MAXALT) 10 MG tablet Take 1 tablet (10 mg total) by mouth as needed for migraine. May repeat in 2 hours if needed 10 tablet 6   No facility-administered medications prior to visit.     Allergies  Allergen Reactions  . Tramadol Other (See Comments)    Per pt - unable describe feeling, just not well  . Penicillins Rash    Has patient had a PCN reaction causing immediate rash, facial/tongue/throat swelling, SOB or lightheadedness with hypotension: unknown,  childhood reaction    ROS As per HPI  PE: Blood pressure 124/85, pulse 84, temperature 98.1 F (36.7 C), temperature source Oral, resp. rate 16, height 5\' 7"  (1.702 m), weight 239 lb (108.4 kg), SpO2 97 %.  Pt examined with Pryor Ochoa, CMA, as chaperone.  Gen: Alert, well appearing.  Patient is oriented to person, place, time, and situation. RLQ abd with dry/indurated hyperpigmented macule c/w a drained furuncle.  Some mild surrounding ecchymoses encircles the old furuncle site.  No fluctuance, tenderness, streaking, or nodularity.     LABS:    Chemistry      Component Value Date/Time   NA 138 01/13/2018 1008   K 4.3 01/13/2018 1008   CL 100 01/13/2018 1008   CO2 30 01/13/2018 1008   BUN 9 01/13/2018 1008   CREATININE 0.89 01/13/2018 1008      Component Value Date/Time   CALCIUM 9.8 01/13/2018 1008   ALKPHOS 77 01/13/2018 1008   AST 16 01/13/2018 1008   ALT 20 01/13/2018 1008   BILITOT 0.5 01/13/2018 1008     Lab Results  Component Value Date   HGBA1C 7.3 (H) 01/13/2018    IMPRESSION AND PLAN:  Furuncle of abd wall, pt squeezed the fluid out of it and there is no sign of any residual fluid. No sign of surrounding cellulitis.  She has ecchymoses present from the recent firm squeezing she did to it in order to get it to drain.  Reassured pt.    An After Visit Summary was printed and given to the patient.  FOLLOW UP: Return if symptoms worsen or fail to improve.  Signed:  Santiago Bumpers, MD           01/28/2018

## 2018-02-09 ENCOUNTER — Encounter: Payer: Self-pay | Admitting: Family Medicine

## 2018-06-10 ENCOUNTER — Other Ambulatory Visit: Payer: Self-pay | Admitting: Family Medicine

## 2018-06-10 NOTE — Telephone Encounter (Signed)
Pt is due for f/u.  Pt advised, she will call back to schedule an apt.  Rx sent for #60 w/ 0RF

## 2018-06-10 NOTE — Telephone Encounter (Signed)
Patient is calling and states she only has enough medication to get through tomorrow 06/11/18. Patient would like a follow up call in regards to this. She states she did not realize it was no refills on the bottle.

## 2018-08-04 DIAGNOSIS — E781 Pure hyperglyceridemia: Secondary | ICD-10-CM

## 2018-08-04 HISTORY — DX: Pure hyperglyceridemia: E78.1

## 2018-08-12 ENCOUNTER — Ambulatory Visit (INDEPENDENT_AMBULATORY_CARE_PROVIDER_SITE_OTHER): Payer: Self-pay | Admitting: Family Medicine

## 2018-08-12 ENCOUNTER — Encounter: Payer: Self-pay | Admitting: Family Medicine

## 2018-08-12 DIAGNOSIS — E559 Vitamin D deficiency, unspecified: Secondary | ICD-10-CM

## 2018-08-12 DIAGNOSIS — F411 Generalized anxiety disorder: Secondary | ICD-10-CM

## 2018-08-12 DIAGNOSIS — E119 Type 2 diabetes mellitus without complications: Secondary | ICD-10-CM

## 2018-08-12 MED ORDER — RIZATRIPTAN BENZOATE 10 MG PO TABS: 10.0000 mg | ORAL_TABLET | ORAL | 6 refills | Status: DC | PRN

## 2018-08-12 MED ORDER — METAXALONE 800 MG PO TABS: 800.0000 mg | ORAL_TABLET | Freq: Four times a day (QID) | ORAL | 2 refills | Status: DC | PRN

## 2018-08-12 MED ORDER — METFORMIN HCL 500 MG PO TABS: 500.0000 mg | ORAL_TABLET | Freq: Two times a day (BID) | ORAL | 5 refills | Status: DC

## 2018-08-12 NOTE — Progress Notes (Signed)
Virtual Visit via Video Note  I connected with pt on 08/12/18 at  3:00 PM EDT by a video enabled telemedicine application and verified that I am speaking with the correct person using two identifiers.  Location patient: home Location provider:work office Persons participating in the virtual visit: patient, myself.  I discussed the limitations of evaluation and management by telemedicine and the availability of in person appointments. The patient expressed understanding and agreed to proceed.  Telemedicine visit is a necessity given the COVID-19 restrictions in place at the current time.  HPI: 38 y/o WF here for f/u DM 2 and chronic anxiety. I last saw her 7-8 mo ago. She had been lost to f/u after having been dx'd with DM 2. At last visit her A1c was 7.3% and I started her on metformin 500 mg bid and referred her to nutritionist.  DM: taking metformin  1 time a day, will take 2 tabs if her diet is "poor" such as when she is working. She has lost 20 lbs.  Says that when she takes 2 tabs and eats low carb diet she feels like her sugar "gets low"---gets shaky. No glucometer.  Still trying to get in with a nutritionist with the [facility] system for cost reasons. Her back is feeling better with wt loss. Still has to take skelaxin. Her HA's are occurring less, has one rizatriptan left, needs RF.  She says her husband was unfaithful to her a few months ago.  Lots of stress but actually dealt with it very well.   GAD: citalopram  qd in the past but weened herself off this and says anxiety generally "normal". Takes lorazepam 0.5mg , 1-2 tid prn in the past.  She takes this med 1-2 times per month at the most.  Last rx was 05/2017. She does not request a new rx for this med today.    ROS: ROS: no CP, no SOB, no wheezing, no cough, no dizziness, no HAs, no rashes, no melena/hematochezia.  No polyuria or polydipsia.  No myalgias or arthralgias.   Past Medical History:  Diagnosis Date  .  Anxiety   . Cervical spondylosis   . Compression fracture of thoracic vertebra (HCC)    Mild; T12  . Degenerative disc disease    Cervical, thoracic, lumbar  . Depression    Situational  . DM (diabetes mellitus), type 2 (HCC) 05/2017   new dx 05/14/17---nonfasting gluc >200, HbA1c 7.6%.  . Eclampsia    Seizure 1 wk after delivery of 3rd child.  Marland Kitchen GERD (gastroesophageal reflux disease)   . Helicobacter pylori gastritis 05/2017   Dx'd by clinical presentation + H pylori IgG positive.  . Hemorrhoids   . History of chronic sinusitis 2012  . History of vitamin D deficiency    Pt says she took replacement dosing and then daily maintenance but no vit D level was rechecked  . Hx of gestational diabetes mellitus, not currently pregnant   . Hx of parotitis 10/2016   Left sided: dx'd at ED in [facility], rx'd keflex and flagyl and recovered uneventfully.  . Lumbar spondylosis   . Migraine    Quiescent since her mid 34s.  Resurgence 2018  . Ruptured cervical disc   . 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 Lovell Sheehan.    Past Surgical History:  Procedure Laterality Date  . CHOLECYSTECTOMY N/A 07/05/2015   Procedure:  LAPAROSCOPIC CHOLECYSTECTOMY WITH INTRAOPERATIVE CHOLANGIOGRAM;  Surgeon: Manus RuddMatthew Tsuei, MD;  Location: [facility] OR;  Service: General;  Laterality: N/A;  . dexa  05/2017   Normal  . DILATION AND EVACUATION  1999  . WISDOM TOOTH EXTRACTION      Family History  Problem Relation Age of Onset  . Depression Mother   . COPD Father   . Heart disease Father   . Clotting disorder Brother   . Osteoporosis Maternal Grandmother   . Cancer Maternal Grandfather   . Kidney disease Maternal Grandfather   . Heart disease Maternal Grandfather   . Arthritis Paternal Grandmother   . Cancer Paternal Grandmother   . Cancer Paternal Grandfather   . Breast cancer Neg Hx        Current Outpatient Medications:  .  cholecalciferol (VITAMIN D) 1000 units tablet, Take 3,000 Units by mouth daily., Disp: , Rfl:  .  citalopram (CELEXA) 40 MG tablet, Take 1 tablet (40 mg total) by mouth daily., Disp: 30 tablet, Rfl: 7 .  fluticasone (FLONASE) 50 MCG/ACT nasal spray, Place 2 sprays into both nostrils daily., Disp: 16 g, Rfl: 12 .  HYDROcodone-acetaminophen (NORCO/VICODIN) 5-325 MG tablet, Take 1-2 tablets by mouth every 6 (six) hours as needed. , Disp: , Rfl:  .  ipratropium (ATROVENT) 0.06 % nasal spray, Place 2 sprays into both nostrils 4 (four) times daily., Disp: 15 mL, Rfl: 0 .  levonorgestrel (MIRENA) 20 MCG/24HR IUD, 1 each by Intrauterine route once., Disp: , Rfl:  .  LORazepam (ATIVAN) 0.5 MG tablet, 1-2 tabs po tid prn anxiety, Disp: 30 tablet, Rfl: 1 .  meloxicam (MOBIC) 15 MG tablet, 1 tap po qd prn--take with food, Disp: 30 tablet, Rfl: 2 .  metaxalone (SKELAXIN) 800 MG tablet, Take 1 tablet (800 mg total) by mouth 4 (four) times daily as needed., Disp: 120 tablet, Rfl: 2 .  metFORMIN (GLUCOPHAGE) 500 MG tablet, TAKE 1 TABLET BY MOUTH TWICE DAILY WITH MEALS, Disp: 60 tablet, Rfl: 0 .  omeprazole (PRILOSEC OTC) 20 MG tablet, Take 40 mg by mouth daily., Disp: , Rfl:  .  ondansetron (ZOFRAN) 8 MG tablet, Take 1 tablet (8 mg total) by mouth every 8 (eight) hours as needed for nausea or vomiting., Disp: 30 tablet, Rfl: 0 .  oxyCODONE (OXY IR/ROXICODONE) 5 MG immediate release tablet, Take 1-2 tablets every 6 hours as needed, Disp: , Rfl:  .  rizatriptan (MAXALT) 10 MG tablet, Take 1 tablet (10 mg total) by mouth as needed for migraine. May repeat in 2 hours if needed, Disp: 10 tablet, Rfl: 6  EXAM:  VITALS per patient if applicable:  GENERAL: alert, oriented, appears well and in no acute distress  HEENT: atraumatic, conjunttiva clear, no obvious abnormalities on inspection of external nose and ears  NECK: normal movements of the head and neck  LUNGS: on  inspection no signs of respiratory distress, breathing rate appears normal, no obvious gross SOB, gasping or wheezing  CV: no obvious cyanosis  MS: moves all visible extremities without noticeable abnormality  PSYCH/NEURO: pleasant and cooperative, no obvious depression or anxiety, speech and thought processing grossly intact  Labs: none today  No results found for: TSH Lab Results  Component Value Date   WBC 9.1 01/13/2018   HGB 15.2 (H) 01/13/2018   HCT 45.7 01/13/2018   MCV 85.0 01/13/2018   PLT 288.0 01/13/2018   Lab Results  Component Value Date   CREATININE 0.89 01/13/2018   BUN 9 01/13/2018   NA  138 01/13/2018   K 4.3 01/13/2018   CL 100 01/13/2018   CO2 30 01/13/2018   Lab Results  Component Value Date   ALT 20 01/13/2018   AST 16 01/13/2018   ALKPHOS 77 01/13/2018   BILITOT 0.5 01/13/2018   Lab Results  Component Value Date   HGBA1C 7.3 (H) 01/13/2018    ASSESSMENT AND PLAN:  Discussed the following assessment and plan:  1) DM 2: diet erratic, mostly due to work schedule and difficulty finding healthy food choices during work hours. It is doubtful that she is have "low sugar" when she takes her 2nd metformin and eats low carbs. We'll see how her HbA1c looks. Also checking BMET and FLP. She'll keep trying to get in with a nutritionist with [facility] system b/c this is more cost effective for her. Congratulated her on her purposeful wt loss of 20 lb since I last saw her.  2) Anxiety: doing well off of citalopram and she is using lorazepam VERY infrequently.  3) Vit d deficiency: she takes otc vit D (she could not recall the dose) but only erratically. She did take 50K q week replacement dose in the past. Check vit D level today.   I discussed the assessment and treatment plan with the patient. The patient was provided an opportunity to ask questions and all were answered. The patient agreed with the plan and demonstrated an understanding of the  instructions.   The patient was advised to call back or seek an in-person evaluation if the symptoms worsen or if the condition fails to improve as anticipated.  F/u: 6 mo.  Will increase frequency of f/u visits to q 3 mo if A1c is up and/or we start getting her to use glucometer at home.  Signed:  Santiago Bumpers, MD           08/12/2018

## 2018-08-23 ENCOUNTER — Other Ambulatory Visit: Payer: Self-pay

## 2018-08-23 ENCOUNTER — Other Ambulatory Visit (INDEPENDENT_AMBULATORY_CARE_PROVIDER_SITE_OTHER): Payer: Managed Care, Other (non HMO)

## 2018-08-23 DIAGNOSIS — E119 Type 2 diabetes mellitus without complications: Secondary | ICD-10-CM

## 2018-08-23 DIAGNOSIS — E559 Vitamin D deficiency, unspecified: Secondary | ICD-10-CM | POA: Diagnosis not present

## 2018-08-23 LAB — LIPID PANEL
Cholesterol: 223 mg/dL — ABNORMAL HIGH (ref 0–200)
HDL: 31.9 mg/dL — ABNORMAL LOW (ref >39.00)
Total CHOL/HDL Ratio: 7
Triglycerides: 708 mg/dL — ABNORMAL HIGH (ref 0.0–149.0)

## 2018-08-23 LAB — BASIC METABOLIC PANEL
BUN: 12 mg/dL (ref 6–23)
CO2: 29 mEq/L (ref 19–32)
Calcium: 9.5 mg/dL (ref 8.4–10.5)
Chloride: 102 mEq/L (ref 96–112)
Creatinine, Ser: 0.8 mg/dL (ref 0.40–1.20)
GFR: 79.65 mL/min (ref >60.00)
Glucose, Bld: 116 mg/dL — ABNORMAL HIGH (ref 70–99)
Potassium: 4.6 mEq/L (ref 3.5–5.1)
Sodium: 139 mEq/L (ref 135–145)

## 2018-08-23 LAB — VITAMIN D 25 HYDROXY (VIT D DEFICIENCY, FRACTURES): VITD: 21.89 ng/mL — ABNORMAL LOW (ref 30.00–100.00)

## 2018-08-23 LAB — LDL CHOLESTEROL, DIRECT: Direct LDL: 75 mg/dL

## 2018-08-23 LAB — HEMOGLOBIN A1C: Hgb A1c MFr Bld: 6.5 % (ref 4.6–6.5)

## 2018-08-27 ENCOUNTER — Encounter: Payer: Self-pay | Admitting: Family Medicine

## 2018-08-30 ENCOUNTER — Other Ambulatory Visit: Payer: Self-pay | Admitting: Family Medicine

## 2018-08-30 MED ORDER — FENOFIBRATE 145 MG PO TABS: 145.0000 mg | ORAL_TABLET | Freq: Every day | ORAL | 2 refills | Status: DC

## 2018-08-31 ENCOUNTER — Telehealth: Payer: Self-pay

## 2018-08-31 ENCOUNTER — Other Ambulatory Visit: Payer: Self-pay

## 2018-08-31 DIAGNOSIS — E781 Pure hyperglyceridemia: Secondary | ICD-10-CM

## 2018-08-31 DIAGNOSIS — L659 Nonscarring hair loss, unspecified: Secondary | ICD-10-CM

## 2018-08-31 NOTE — Progress Notes (Signed)
Future lab orders

## 2018-08-31 NOTE — Telephone Encounter (Signed)
My Chart message sent

## 2018-09-28 IMAGING — US US AXILLARY LEFT
1 series · 2 of 2 positions shown · non-contrast
Comparison: None.

CLINICAL DATA: Patient complains of a palpable abnormality in the
left axilla.

EXAM:
DIGITAL DIAGNOSTIC BILATERAL MAMMOGRAM WITH CAD AND TOMO
ULTRASOUND LEFT BREAST

[Series 1: us axillary left · 0.07mm/px · 2 of 2 slices shown]
[im 1/2]
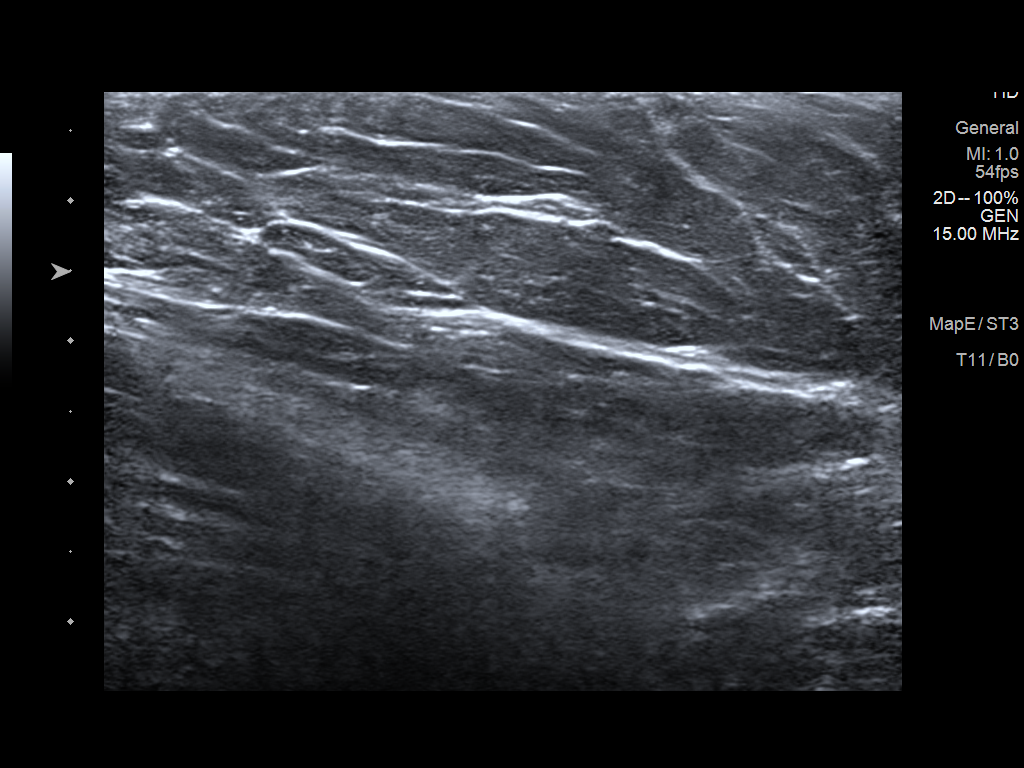
[im 2/2]
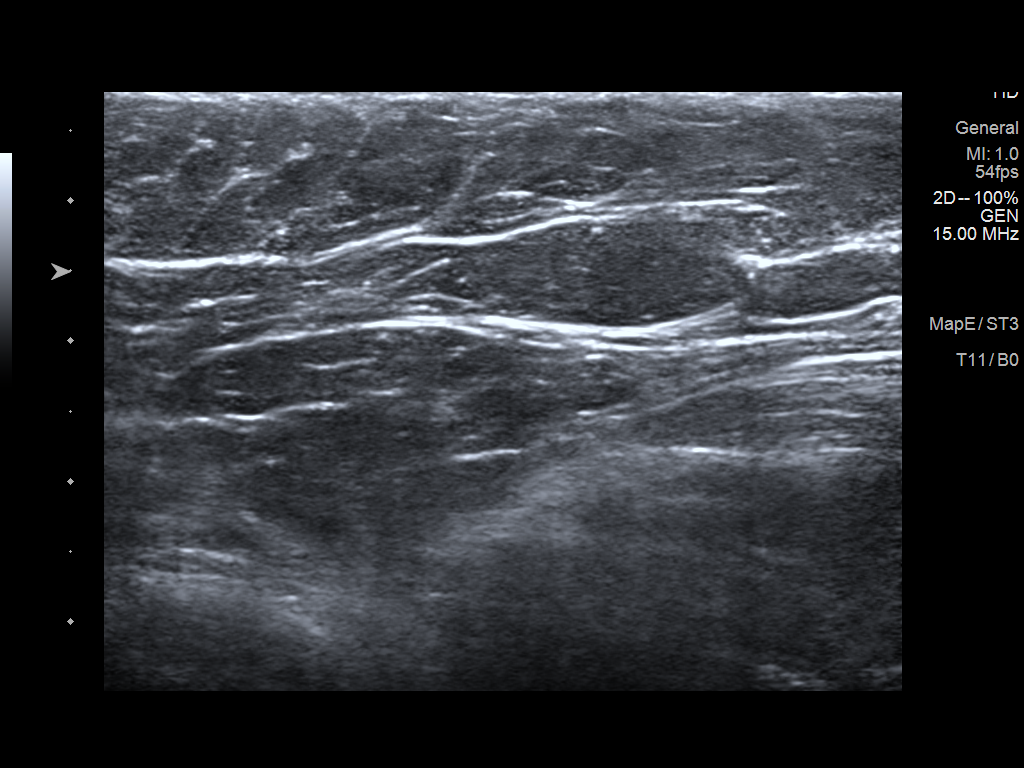

[2 of 2 positions shown; findings below may reference images not displayed]

ACR Breast Density Category b: There are scattered areas of
fibroglandular density.
FINDINGS: No suspicious mass, malignant type microcalcifications or distortion
detected in either breast. Spot tangential view of the left axilla
does not show a suspicious mass.

Mammographic images were processed with CAD.

On physical exam, I do not palpate a mass in the left axilla.

Targeted ultrasound is performed, showing normal tissue in the left
axilla. No enlarged adenopathy, mass or abnormal shadowing detected.
IMPRESSION: No evidence of malignancy in either breast.

RECOMMENDATION:
If the clinical exam remains benign/stable screening mammography can
be deferred until the age of 40.

I have discussed the findings and recommendations with the patient.
Results were also provided in writing at the conclusion of the
visit. If applicable, a reminder letter will be sent to the patient
regarding the next appointment.

BI-RADS CATEGORY  1: Negative.

## 2019-02-09 ENCOUNTER — Ambulatory Visit (INDEPENDENT_AMBULATORY_CARE_PROVIDER_SITE_OTHER): Payer: Managed Care, Other (non HMO) | Admitting: Family Medicine

## 2019-02-09 ENCOUNTER — Encounter: Payer: Self-pay | Admitting: Family Medicine

## 2019-02-09 VITALS — HR 76 | Temp 97.9°F

## 2019-02-09 DIAGNOSIS — L0232 Furuncle of buttock: Secondary | ICD-10-CM

## 2019-02-09 MED ORDER — MUPIROCIN 2 % EX OINT: 1.0000 "application " | TOPICAL_OINTMENT | Freq: Three times a day (TID) | CUTANEOUS | 0 refills | Status: DC

## 2019-02-09 MED ORDER — DOXYCYCLINE HYCLATE 100 MG PO CAPS: 100.0000 mg | ORAL_CAPSULE | Freq: Two times a day (BID) | ORAL | 0 refills | Status: AC

## 2019-02-09 NOTE — Progress Notes (Signed)
Virtual Visit via Video Note  I connected with Jenna Taylor on 02/09/19 at  4:00 PM EDT by a video enabled telemedicine application and verified that I am speaking with the correct person using two identifiers.  Location patient: home Location provider:work or home office Persons participating in the virtual visit: patient, provider  I discussed the limitations of evaluation and management by telemedicine and the availability of in person appointments. The patient expressed understanding and agreed to proceed.  Telemedicine visit is a necessity given the COVID-19 restrictions in place at the current time.  HPI: 38 y/o WF being seen today for "boil on buttocks". Noted 2 wks ago, "popped" and resolved some. Returned today and she "popped it again" and it drained today. Minimal ongoing clear drainage.  Mildly tender.  No fever or malaise. Says it feels like it has gone down A LOT since this morning.  ROS: See pertinent positives and negatives per HPI.  Past Medical History:  Diagnosis Date  . Anxiety   . Cervical spondylosis   . Compression fracture of thoracic vertebra (HCC)    Mild; T12  . Degenerative disc disease    Cervical, thoracic, lumbar  . Depression    Situational  . DM (diabetes mellitus), type 2 (HCC) 05/2017   new dx 05/14/17---nonfasting gluc >200, HbA1c 7.6%.  . Eclampsia    Seizure 1 wk after delivery of 3rd child.  Marland Kitchen GERD (gastroesophageal reflux disease)   . Helicobacter pylori gastritis 05/2017   Dx'd by clinical presentation + H pylori IgG positive.  . Hemorrhoids   . History of chronic sinusitis 2012  . History of vitamin D deficiency    Jenna Taylor says she took replacement dosing and then daily maintenance but no vit D level was rechecked  . Hx of parotitis 10/2016   Left sided: dx'd at ED in [facility], rx'd keflex and flagyl and recovered uneventfully.  . Hypertriglyceridemia 08/2018   gemfibrozil recommended  . Lumbar spondylosis   . Migraine    Quiescent since  her mid 38s.  Resurgence 2018  . Ruptured cervical disc   . 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 Lovell Sheehan.    Past Surgical History:  Procedure Laterality Date  . CHOLECYSTECTOMY N/A 07/05/2015   Procedure: LAPAROSCOPIC CHOLECYSTECTOMY WITH INTRAOPERATIVE CHOLANGIOGRAM;  Surgeon: Manus Rudd, MD;  Location: [facility] OR;  Service: General;  Laterality: N/A;  . dexa  05/2017   Normal  . DILATION AND EVACUATION  1999  . WISDOM TOOTH EXTRACTION      Family History  Problem Relation Age of Onset  . Depression Mother   . COPD Father   . Heart disease Father   . Clotting disorder Brother   . Osteoporosis Maternal Grandmother   . Cancer Maternal Grandfather   . Kidney disease Maternal Grandfather   . Heart disease Maternal Grandfather   . Arthritis Paternal Grandmother   . Cancer Paternal Grandmother   . Cancer Paternal Grandfather   . Breast cancer Neg Hx     SOCIAL HX:  Social History   Socioeconomic History  . Marital status: Married    Spouse name: Not on file  . Number of children: Not on file  . Years of education: Not on file  . Highest education level: Not on file  Occupational History  . Not on file  Social Needs  . Financial resource strain: Not on file  .  Food insecurity    Worry: Not on file    Inability: Not on file  . Transportation needs    Medical: Not on file    Non-medical: Not on file  Tobacco Use  . Smoking status: Former Smoker    Packs/day: 0.50    Types: Cigarettes    Quit date: 05/15/2016    Years since quitting: 2.7  . Smokeless tobacco: Never Used  Substance and Sexual Activity  . Alcohol use: Yes    Comment: socially  . Drug use: No  . Sexual activity: Yes    Birth control/protection: I.U.D.  Lifestyle  . Physical activity    Days per week: Not on file    Minutes per session: Not on file  . Stress: Not on file  Relationships   . Social Herbalist on phone: Not on file    Gets together: Not on file    Attends religious service: Not on file    Active member of club or organization: Not on file    Attends meetings of clubs or organizations: Not on file    Relationship status: Not on file  Other Topics Concern  . Not on file  Social History Narrative   Single, 3 children (63 yr, 11 yr, 7 yr).   Occupation: CNA at [employer].   From [place].   Tobacco: 10 pack-yr hx--quitting with e-cigs as of 07/2013.   Alcohol: rare.     No drugs.     Current Outpatient Medications:  .  cholecalciferol (VITAMIN D) 1000 units tablet, Take 3,000 Units by mouth daily., Disp: , Rfl:  .  fenofibrate (TRICOR) 145 MG tablet, Take 1 tablet (145 mg total) by mouth daily., Disp: 30 tablet, Rfl: 2 .  fluticasone (FLONASE) 50 MCG/ACT nasal spray, Place 2 sprays into both nostrils daily., Disp: 16 g, Rfl: 12 .  HYDROcodone-acetaminophen (NORCO/VICODIN) 5-325 MG tablet, Take 1-2 tablets by mouth every 6 (six) hours as needed. , Disp: , Rfl:  .  levonorgestrel (MIRENA) 20 MCG/24HR IUD, 1 each by Intrauterine route once., Disp: , Rfl:  .  LORazepam (ATIVAN) 0.5 MG tablet, 1-2 tabs po tid prn anxiety, Disp: 30 tablet, Rfl: 1 .  meloxicam (MOBIC) 15 MG tablet, 1 tap po qd prn--take with food, Disp: 30 tablet, Rfl: 2 .  metaxalone (SKELAXIN) 800 MG tablet, Take 1 tablet (800 mg total) by mouth 4 (four) times daily as needed., Disp: 120 tablet, Rfl: 2 .  metFORMIN (GLUCOPHAGE) 500 MG tablet, Take 1 tablet (500 mg total) by mouth 2 (two) times daily with a meal., Disp: 60 tablet, Rfl: 5 .  omeprazole (PRILOSEC OTC) 20 MG tablet, Take 40 mg by mouth daily., Disp: , Rfl:  .  ondansetron (ZOFRAN) 8 MG tablet, Take 1 tablet (8 mg total) by mouth every 8 (eight) hours as needed for nausea or vomiting., Disp: 30 tablet, Rfl: 0 .  oxyCODONE (OXY IR/ROXICODONE) 5 MG immediate release tablet, Take 1-2 tablets every 6 hours as needed,  Disp: , Rfl:  .  rizatriptan (MAXALT) 10 MG tablet, Take 1 tablet (10 mg total) by mouth as needed for migraine. May repeat in 2 hours if needed, Disp: 10 tablet, Rfl: 6  EXAM: Exam chaperoned by CMA Marlene Bast  VITALS per patient if applicable: Pulse 76   Temp 97.9 F (36.6 C) (Temporal)    GENERAL: alert, oriented, appears well and in no acute distress  HEENT: atraumatic, conjunttiva clear, no obvious abnormalities on  inspection of external nose and ears  NECK: normal movements of the head and neck  LUNGS: on inspection no signs of respiratory distress, breathing rate appears normal, no obvious gross SOB, gasping or wheezing  CV: no obvious cyanosis  MS: moves all visible extremities without noticeable abnormality  PSYCH/NEURO: pleasant and cooperative, no obvious depression or anxiety, speech and thought processing grossly intact  BUTTOCKS exam->R lower glut region with dime sized area of erythema, no nodularity could be discerned. No active drainage.  No surrounding erythema.  LABS: none today    Chemistry      Component Value Date/Time   NA 139 08/23/2018 0939   K 4.6 08/23/2018 0939   CL 102 08/23/2018 0939   CO2 29 08/23/2018 0939   BUN 12 08/23/2018 0939   CREATININE 0.80 08/23/2018 0939      Component Value Date/Time   CALCIUM 9.5 08/23/2018 0939   ALKPHOS 77 01/13/2018 1008   AST 16 01/13/2018 1008   ALT 20 01/13/2018 1008   BILITOT 0.5 01/13/2018 1008      ASSESSMENT AND PLAN:  Discussed the following assessment and plan:  Furuncle, draining. Continue moist heat. I will eRx bactroban ointment as well as a 7d course of doxycycline. Dressing instructions given. Signs/symptoms to call or return for were reviewed and Jenna Taylor expressed understanding.  I discussed the assessment and treatment plan with the patient. The patient was provided an opportunity to ask questions and all were answered. The patient agreed with the plan and demonstrated an  understanding of the instructions.   The patient was advised to call back or seek an in-person evaluation if the symptoms worsen or if the condition fails to improve as anticipated.  F/u: prn  Signed:  Santiago BumpersPhil Kalvyn Desa, MD           02/09/2019

## 2019-02-10 ENCOUNTER — Ambulatory Visit: Payer: Managed Care, Other (non HMO) | Admitting: Family Medicine

## 2019-10-07 ENCOUNTER — Other Ambulatory Visit: Payer: Self-pay | Admitting: Family Medicine

## 2019-10-07 NOTE — Telephone Encounter (Signed)
Patient calling to see if Dr will send in today she is currently out if medication. Going out of town and just asking for 10 tablets   Please call and advise

## 2019-10-07 NOTE — Telephone Encounter (Signed)
30 day supply sent for Metformin to local pharmacy. She was advised currently due for next f/u for DM and anxiety, would need to schedule for refills and labwork

## 2019-12-06 ENCOUNTER — Encounter: Payer: Self-pay | Admitting: Family Medicine

## 2019-12-06 NOTE — Telephone Encounter (Signed)
Please advise, thanks.

## 2019-12-19 ENCOUNTER — Encounter: Payer: Self-pay | Admitting: Family Medicine

## 2019-12-20 MED ORDER — METFORMIN HCL 500 MG PO TABS: 500.0000 mg | ORAL_TABLET | Freq: Two times a day (BID) | ORAL | 0 refills | Status: DC

## 2019-12-20 MED ORDER — FENOFIBRATE 145 MG PO TABS: 145.0000 mg | ORAL_TABLET | Freq: Every day | ORAL | 0 refills | Status: DC

## 2019-12-20 NOTE — Telephone Encounter (Signed)
Patient was last seen 08/12/18 along with labs, advised to f/u 6 months. She was supposed to return for tsh and lipid panel recheck. Does she need to schedule o/v for next f/u?  Please advise, thanks.

## 2019-12-20 NOTE — Telephone Encounter (Signed)
Needs in person f/u (fasting) and we'll do labs at that time. I'll send 1 mo of her metformin and fenofibrate now so she doesn't run out.  I'll enter labs future if she wants to come in for fasting lab visit prior to her o/v.

## 2019-12-22 NOTE — Telephone Encounter (Signed)
MyChart message read.

## 2020-06-28 ENCOUNTER — Encounter: Payer: Self-pay | Admitting: Family Medicine

## 2020-09-20 ENCOUNTER — Other Ambulatory Visit: Payer: Self-pay

## 2020-09-20 ENCOUNTER — Encounter: Payer: Self-pay | Admitting: Family Medicine

## 2020-09-20 ENCOUNTER — Ambulatory Visit (INDEPENDENT_AMBULATORY_CARE_PROVIDER_SITE_OTHER): Payer: Managed Care, Other (non HMO) | Admitting: Family Medicine

## 2020-09-20 VITALS — BP 125/75 | HR 90 | Temp 98.0°F | Resp 16 | Ht 67.0 in | Wt 231.8 lb

## 2020-09-20 DIAGNOSIS — L739 Follicular disorder, unspecified: Secondary | ICD-10-CM

## 2020-09-20 DIAGNOSIS — G950 Syringomyelia and syringobulbia: Secondary | ICD-10-CM | POA: Insufficient documentation

## 2020-09-20 MED ORDER — MUPIROCIN 2 % EX OINT: 1.0000 "application " | TOPICAL_OINTMENT | Freq: Three times a day (TID) | CUTANEOUS | 0 refills | Status: DC

## 2020-09-20 NOTE — Progress Notes (Signed)
OFFICE VISIT  09/20/2020  CC:  Chief Complaint  Patient presents with  . Abscess in vaginal area    Right side, occurred due shaving. Almost a week   HPI:    Patient is a 40 y.o. Caucasian female with hx of DM 2 who presents for "abscess in private area due to shaving". I've not seen her for chronic illness f/u since 08/2018, at which time I started her on med for severe hypertriglyceridemia. Her a1c had improved to 6.5% at that time. Her last acute visit was 02/09/19 for small furuncle on buttocks.  I encouraged continued use of heat and rx'd bactroban and doxycycline.  CURRENT HPI: R groin red spot-->onset 3 d/a of little red spot that she could not palpate, hurt some, started a couple days after she shaved in the area.  Started applying bactroban at onset. Feels better today. No malaise or fevers.  Past Medical History:  Diagnosis Date  . Anxiety   . Cervical spondylosis   . Compression fracture of thoracic vertebra (HCC)    Mild; T12  . Degenerative disc disease    Cervical, thoracic, lumbar  . Depression    Situational  . DM (diabetes mellitus), type 2 (HCC) 05/2017   new dx 05/14/17---nonfasting gluc >200, HbA1c 7.6%.  . Eclampsia    Seizure 1 wk after delivery of 3rd child.  Marland Kitchen GERD (gastroesophageal reflux disease)   . Helicobacter pylori gastritis 05/2017   Dx'd by clinical presentation + H pylori IgG positive.  . Hemorrhoids   . History of chronic sinusitis 2012  . History of vitamin D deficiency    Pt says she took replacement dosing and then daily maintenance but no vit D level was rechecked  . Hx of parotitis 10/2016   Left sided: dx'd at ED in [facility], rx'd keflex and flagyl and recovered uneventfully.  . Hypertriglyceridemia 08/2018   gemfibrozil recommended  . Lumbar spondylosis   . Migraine    Quiescent since her mid 34s.  Resurgence 2018  . Ruptured cervical disc   . Syrinx of spinal cord (HCC)    Small thoracic syrinx--"stable over multiple years  with multiple MRIs", "no need to worry about any further" per NS, Dr. Lovell Sheehan.    Past Surgical History:  Procedure Laterality Date  . CHOLECYSTECTOMY N/A 07/05/2015   Procedure: LAPAROSCOPIC CHOLECYSTECTOMY WITH INTRAOPERATIVE CHOLANGIOGRAM;  Surgeon: Manus Rudd, MD;  Location: [facility] OR;  Service: General;  Laterality: N/A;  . dexa  05/2017   Normal  . DILATION AND EVACUATION  1999  . WISDOM TOOTH EXTRACTION      Outpatient Medications Prior to Visit  Medication Sig Dispense Refill  . cholecalciferol (VITAMIN D) 1000 units tablet Take 3,000 Units by mouth daily.    . fenofibrate (TRICOR) 145 MG tablet Take 1 tablet (145 mg total) by mouth daily. 30 tablet 0  . levonorgestrel (MIRENA) 20 MCG/24HR IUD 1 each by Intrauterine route once.    . metaxalone (SKELAXIN) 800 MG tablet Take 1 tablet (800 mg total) by mouth 4 (four) times daily as needed. 120 tablet 2  . metFORMIN (GLUCOPHAGE) 500 MG tablet Take 1 tablet (500 mg total) by mouth 2 (two) times daily with a meal. 60 tablet 0  . mupirocin ointment (BACTROBAN) 2 % Apply 1 application topically 3 (three) times daily. 15 g 0  . fluticasone (FLONASE) 50 MCG/ACT nasal spray Place 2 sprays into both nostrils daily. (Patient not taking: Reported on 09/20/2020) 16 g 12  . HYDROcodone-acetaminophen (NORCO/VICODIN)  5-325 MG tablet Take 1-2 tablets by mouth every 6 (six) hours as needed.  (Patient not taking: Reported on 09/20/2020)    . LORazepam (ATIVAN) 0.5 MG tablet 1-2 tabs po tid prn anxiety (Patient not taking: Reported on 09/20/2020) 30 tablet 1  . meloxicam (MOBIC) 15 MG tablet 1 tap po qd prn--take with food (Patient not taking: Reported on 09/20/2020) 30 tablet 2  . omeprazole (PRILOSEC OTC) 20 MG tablet Take 40 mg by mouth daily. (Patient not taking: Reported on 09/20/2020)    . ondansetron (ZOFRAN) 8 MG tablet Take 1 tablet (8 mg total) by mouth every 8 (eight) hours as needed for nausea or vomiting. (Patient not taking: Reported on 09/20/2020)  30 tablet 0  . oxyCODONE (OXY IR/ROXICODONE) 5 MG immediate release tablet Take 1-2 tablets every 6 hours as needed (Patient not taking: Reported on 09/20/2020)    . rizatriptan (MAXALT) 10 MG tablet Take 1 tablet (10 mg total) by mouth as needed for migraine. May repeat in 2 hours if needed (Patient not taking: Reported on 09/20/2020) 10 tablet 6   No facility-administered medications prior to visit.    Allergies  Allergen Reactions  . Tramadol Other (See Comments)    Per pt - unable describe feeling, just not well  . Penicillins Rash    Has patient had a PCN reaction causing immediate rash, facial/tongue/throat swelling, SOB or lightheadedness with hypotension: unknown, childhood reaction    ROS As per HPI  PE: Vitals with BMI 09/20/2020 02/09/2019 01/28/2018  Height 5\' 7"  - 5\' 7"   Weight 231 lbs 13 oz - 239 lbs  BMI 36.3 - 37.42  Systolic 125 - 124  Diastolic 75 - 85  Pulse 90 76 84   Exam chaperoned by , CMA.  Gen: Alert, well appearing.  Patient is oriented to person, place, time, and situation. AFFECT: pleasant, lucid thought and speech. R side of perineum just lateral and superior to labia majora is a 2 mm erythematous papule with hair follicle in the center.  No induration, no drainage tract, no sub q nodularity or fluctuance.  The area has been shaved.  LABS:    Chemistry      Component Value Date/Time   NA 139 08/23/2018 0939   K 4.6 08/23/2018 0939   CL 102 08/23/2018 0939   CO2 29 08/23/2018 0939   BUN 12 08/23/2018 0939   CREATININE 0.80 08/23/2018 0939      Component Value Date/Time   CALCIUM 9.5 08/23/2018 0939   ALKPHOS 77 01/13/2018 1008   AST 16 01/13/2018 1008   ALT 20 01/13/2018 1008   BILITOT 0.5 01/13/2018 1008     Lab Results  Component Value Date   CHOL 223 (H) 08/23/2018   HDL 31.90 (L) 08/23/2018   LDLDIRECT 75.0 08/23/2018   TRIG (H) 08/23/2018    708.0 Triglyceride is over 400; calculations on Lipids are invalid.    CHOLHDL 7 08/23/2018   Lab Results  Component Value Date   HGBA1C 6.5 08/23/2018   IMPRESSION AND PLAN:  Small area of folliculitis from recent abrasion due to shaving. Cont bactroban ointment tid (it is already improving with this treatment). Warm compress to area bid.  Noncompliance with medicines/medical follow up-->encouraged her to make separate appt to reassess her DM and trigs (I last RF'd her metformin for 30d supply Aug 2021 and did 30d supply of fenofibrate at that time as well.  An After Visit Summary was printed and given to the patient.  FOLLOW UP: Return for make appt (fasting) for follow up of diabetes and high triglycerides.  Signed:  Santiago Bumpers, MD           09/20/2020

## 2020-09-24 ENCOUNTER — Ambulatory Visit: Payer: Managed Care, Other (non HMO) | Admitting: Family Medicine

## 2020-09-24 NOTE — Progress Notes (Deleted)
Office Note 09/24/2020  CC: No chief complaint on file.   HPI:  Jenna Taylor is a 40 y.o. White female who is here for annual health maintenance exam and f/u DM 2 and hypertriglyceridemia. A/P as of last visit (acute visit 3 d/a): "Small area of folliculitis from recent abrasion due to shaving. Cont bactroban ointment tid (it is already improving with this treatment). Warm compress to area bid.  Noncompliance with medicines/medical follow up-->encouraged her to make separate appt to reassess her DM and trigs (I last RF'd her metformin for 30d supply Aug 2021 and did 30d supply of fenofibrate at that time as well."  INTERIM HX: ***  I last saw her for chronic illness f/u 08/12/18->lost to f/u for 2 yrs. Her a1c improved to 6.5% at that time after having been on metformin. Trigs were very high so I rx her on fenofibrate (she preferred a qd med so I avoided gemfibrozil). I last RF'd her metformin for 30d supply Aug 2021 and did 30d supply of fenofibrate at that time as well.  DM:  Hypertrig:  Vit D def:  April 2020 level <30, I recommended 2000 U/day vit D.   PMP AWARE reviewed today: most recent rx for *** was filled ***, # ***, rx by ***. No red flags.  Past Medical History:  Diagnosis Date  . Anxiety   . Cervical spondylosis   . Compression fracture of thoracic vertebra (HCC)    Mild; T12  . Degenerative disc disease    Cervical, thoracic, lumbar  . Depression    Situational  . DM (diabetes mellitus), type 2 (HCC) 05/2017   new dx 05/14/17---nonfasting gluc >200, HbA1c 7.6%.  . Eclampsia    Seizure 1 wk after delivery of 3rd child.  Marland Kitchen GERD (gastroesophageal reflux disease)   . Helicobacter pylori gastritis 05/2017   Dx'd by clinical presentation + H pylori IgG positive.  . Hemorrhoids   . History of chronic sinusitis 2012  . History of vitamin D deficiency    Pt says she took replacement dosing and then daily maintenance but no vit D level was rechecked  .  Hx of parotitis 10/2016   Left sided: dx'd at ED in [facility], rx'd keflex and flagyl and recovered uneventfully.  . Hypertriglyceridemia 08/2018   gemfibrozil recommended  . Lumbar spondylosis   . Migraine    Quiescent since her mid 46s.  Resurgence 2018  . Ruptured cervical disc   . 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 Lovell Sheehan.    Past Surgical History:  Procedure Laterality Date  . CHOLECYSTECTOMY N/A 07/05/2015   Procedure: LAPAROSCOPIC CHOLECYSTECTOMY WITH INTRAOPERATIVE CHOLANGIOGRAM;  Surgeon: Manus Rudd, MD;  Location: [facility] OR;  Service: General;  Laterality: N/A;  . dexa  05/2017   Normal  . DILATION AND EVACUATION  1999  . WISDOM TOOTH EXTRACTION      Family History  Problem Relation Age of Onset  . Depression Mother   . COPD Father   . Heart disease Father   . Clotting disorder Brother   . Osteoporosis Maternal Grandmother   . Cancer Maternal Grandfather   . Kidney disease Maternal Grandfather   . Heart disease Maternal Grandfather   . Arthritis Paternal Grandmother   . Cancer Paternal Grandmother   . Cancer Paternal Grandfather   . Breast cancer Neg Hx     Social History  Socioeconomic History  . Marital status: Single    Spouse name: Not on file  . Number of children: Not on file  . Years of education: Not on file  . Highest education level: Not on file  Occupational History  . Not on file  Tobacco Use  . Smoking status: Former Smoker    Packs/day: 0.50    Types: Cigarettes    Quit date: 05/15/2016    Years since quitting: 4.3  . Smokeless tobacco: Never Used  Substance and Sexual Activity  . Alcohol use: Yes    Comment: socially  . Drug use: No  . Sexual activity: Yes    Birth control/protection: I.U.D.  Other Topics Concern  . Not on file  Social History Narrative   Single, 3 children (13 yr, 11 yr, 7 yr).   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   Tobacco: 10 pack-yr hx--quitting with e-cigs as of 07/2013.   Alcohol: rare.     No drugs.   Social Determinants of Health   Financial Resource Strain: Not on file  Food Insecurity: Not on file  Transportation Needs: Not on file  Physical Activity: Not on file  Stress: Not on file  Social Connections: Not on file  Intimate Partner Violence: Not on file    Outpatient Medications Prior to Visit  Medication Sig Dispense Refill  . cholecalciferol (VITAMIN D) 1000 units tablet Take 3,000 Units by mouth daily.    . fenofibrate (TRICOR) 145 MG tablet Take 1 tablet (145 mg total) by mouth daily. 30 tablet 0  . fluticasone (FLONASE) 50 MCG/ACT nasal spray Place 2 sprays into both nostrils daily. (Patient not taking: Reported on 09/20/2020) 16 g 12  . HYDROcodone-acetaminophen (NORCO/VICODIN) 5-325 MG tablet Take 1-2 tablets by mouth every 6 (six) hours as needed.  (Patient not taking: Reported on 09/20/2020)    . levonorgestrel (MIRENA) 20 MCG/24HR IUD 1 each by Intrauterine route once.    Marland Kitchen LORazepam (ATIVAN) 0.5 MG tablet 1-2 tabs po tid prn anxiety (Patient not taking: Reported on 09/20/2020) 30 tablet 1  . meloxicam (MOBIC) 15 MG tablet 1 tap po qd prn--take with food (Patient not taking: Reported on 09/20/2020) 30 tablet 2  . metaxalone (SKELAXIN) 800 MG tablet Take 1 tablet (800 mg total) by mouth 4 (four) times daily as needed. 120 tablet 2  . metFORMIN (GLUCOPHAGE) 500 MG tablet Take 1 tablet (500 mg total) by mouth 2 (two) times daily with a meal. 60 tablet 0  . mupirocin ointment (BACTROBAN) 2 % Apply 1 application topically 3 (three) times daily. 15 g 0  . omeprazole (PRILOSEC OTC) 20 MG tablet Take 40 mg by mouth daily. (Patient not taking: Reported on 09/20/2020)    . ondansetron (ZOFRAN) 8 MG tablet Take 1 tablet (8 mg total) by mouth every 8 (eight) hours as needed for nausea or vomiting. (Patient not taking: Reported on 09/20/2020) 30 tablet  0  . oxyCODONE (OXY IR/ROXICODONE) 5 MG immediate release tablet Take 1-2 tablets every 6 hours as needed (Patient not taking: Reported on 09/20/2020)    . rizatriptan (MAXALT) 10 MG tablet Take 1 tablet (10 mg total) by mouth as needed for migraine. May repeat in 2 hours if needed (Patient not taking: Reported on 09/20/2020) 10 tablet 6   No facility-administered medications prior to visit.    Allergies  Allergen Reactions  . Tramadol Other (See Comments)    Per pt - unable describe feeling, just not  well  . Penicillins Rash    Has patient had a PCN reaction causing immediate rash, facial/tongue/throat swelling, SOB or lightheadedness with hypotension: unknown, childhood reaction    ROS *** PE; Vitals with BMI 09/20/2020 02/09/2019 01/28/2018  Height 5\' 7"  - 5\' 7"   Weight 231 lbs 13 oz - 239 lbs  BMI 36.3 - 37.42  Systolic 125 - 124  Diastolic 75 - 85  Pulse 90 76 84     *** Pertinent labs:  No results found for: TSH Lab Results  Component Value Date   WBC 9.1 01/13/2018   HGB 15.2 (H) 01/13/2018   HCT 45.7 01/13/2018   MCV 85.0 01/13/2018   PLT 288.0 01/13/2018   Lab Results  Component Value Date   CREATININE 0.80 08/23/2018   BUN 12 08/23/2018   NA 139 08/23/2018   K 4.6 08/23/2018   CL 102 08/23/2018   CO2 29 08/23/2018   Lab Results  Component Value Date   ALT 20 01/13/2018   AST 16 01/13/2018   ALKPHOS 77 01/13/2018   BILITOT 0.5 01/13/2018   Lab Results  Component Value Date   CHOL 223 (H) 08/23/2018   Lab Results  Component Value Date   HDL 31.90 (L) 08/23/2018   No results found for: [facility] Lab Results  Component Value Date   TRIG (H) 08/23/2018    708.0 Triglyceride is over 400; calculations on Lipids are invalid.   Lab Results  Component Value Date   CHOLHDL 7 08/23/2018   Lab Results  Component Value Date   HGBA1C 6.5 08/23/2018   ASSESSMENT AND PLAN:   1) DM 2 Hba1c, urine microalb/cr, and lytes/cr today. Feet exam today.  2)  Hypertriglyceridemia FLP and hepatic panel today.  3) Health maintenance exam: Reviewed age and gender appropriate health maintenance issues (prudent diet, regular exercise, health risks of tobacco and excessive alcohol, use of seatbelts, fire alarms in home, use of sunscreen).  Also reviewed age and gender appropriate health screening as well as vaccine recommendations. Vaccines: Prevnar 20 indicated->***.  Tdap->***.   Labs: cbc, cmet, flp, tsh, hba1c, urine microalb/cr. Cervical ca screening: Breast ca screening: screening mammogram ordered. Colon ca screening:  average risk patient= as per latest guidelines, start screening at 79 yrs of age.  An After Visit Summary was printed and given to the patient.  FOLLOW UP:  No follow-ups on file.  Signed:  08/25/2018, MD           09/24/2020

## 2020-09-28 ENCOUNTER — Ambulatory Visit: Payer: Managed Care, Other (non HMO) | Admitting: Family Medicine

## 2020-09-28 ENCOUNTER — Other Ambulatory Visit: Payer: Self-pay

## 2020-09-28 ENCOUNTER — Encounter: Payer: Self-pay | Admitting: Family Medicine

## 2020-09-28 VITALS — BP 128/85 | HR 90 | Temp 97.7°F | Resp 16 | Ht 67.0 in | Wt 233.4 lb

## 2020-09-28 DIAGNOSIS — Z0000 Encounter for general adult medical examination without abnormal findings: Secondary | ICD-10-CM

## 2020-09-28 DIAGNOSIS — E781 Pure hyperglyceridemia: Secondary | ICD-10-CM | POA: Diagnosis not present

## 2020-09-28 DIAGNOSIS — E119 Type 2 diabetes mellitus without complications: Secondary | ICD-10-CM

## 2020-09-28 DIAGNOSIS — Z1231 Encounter for screening mammogram for malignant neoplasm of breast: Secondary | ICD-10-CM | POA: Diagnosis not present

## 2020-09-28 DIAGNOSIS — E559 Vitamin D deficiency, unspecified: Secondary | ICD-10-CM | POA: Diagnosis not present

## 2020-09-28 DIAGNOSIS — Z23 Encounter for immunization: Secondary | ICD-10-CM

## 2020-09-28 LAB — COMPREHENSIVE METABOLIC PANEL
ALT: 17 U/L (ref 0–35)
AST: 13 U/L (ref 0–37)
Albumin: 4.1 g/dL (ref 3.5–5.2)
Alkaline Phosphatase: 66 U/L (ref 39–117)
BUN: 10 mg/dL (ref 6–23)
CO2: 25 mEq/L (ref 19–32)
Calcium: 9.3 mg/dL (ref 8.4–10.5)
Chloride: 102 mEq/L (ref 96–112)
Creatinine, Ser: 0.82 mg/dL (ref 0.40–1.20)
GFR: 88.75 mL/min (ref >60.00)
Glucose, Bld: 169 mg/dL — ABNORMAL HIGH (ref 70–99)
Potassium: 4 mEq/L (ref 3.5–5.1)
Sodium: 138 mEq/L (ref 135–145)
Total Bilirubin: 0.4 mg/dL (ref 0.2–1.2)
Total Protein: 6.7 g/dL (ref 6.0–8.3)

## 2020-09-28 LAB — LIPID PANEL
Cholesterol: 197 mg/dL (ref 0–200)
HDL: 31.3 mg/dL — ABNORMAL LOW (ref >39.00)
Total CHOL/HDL Ratio: 6
Triglycerides: 456 mg/dL — ABNORMAL HIGH (ref 0.0–149.0)

## 2020-09-28 LAB — VITAMIN D 25 HYDROXY (VIT D DEFICIENCY, FRACTURES): VITD: 23.81 ng/mL — ABNORMAL LOW (ref 30.00–100.00)

## 2020-09-28 LAB — HEMOGLOBIN A1C: Hgb A1c MFr Bld: 7.9 % — ABNORMAL HIGH (ref 4.6–6.5)

## 2020-09-28 LAB — MICROALBUMIN / CREATININE URINE RATIO
Creatinine,U: 74.8 mg/dL
Microalb Creat Ratio: 0.9 mg/g (ref 0.0–30.0)
Microalb, Ur: <0.7 mg/dL (ref 0.0–1.9)

## 2020-09-28 LAB — CBC WITH DIFFERENTIAL/PLATELET
Basophils Absolute: 0.1 10*3/uL (ref 0.0–0.1)
Basophils Relative: 0.9 % (ref 0.0–3.0)
Eosinophils Absolute: 0.6 10*3/uL (ref 0.0–0.7)
Eosinophils Relative: 4.1 % (ref 0.0–5.0)
HCT: 46.2 % — ABNORMAL HIGH (ref 36.0–46.0)
Hemoglobin: 15.6 g/dL — ABNORMAL HIGH (ref 12.0–15.0)
Lymphocytes Relative: 22.5 % (ref 12.0–46.0)
Lymphs Abs: 3.4 10*3/uL (ref 0.7–4.0)
MCHC: 33.6 g/dL (ref 30.0–36.0)
MCV: 84.6 fl (ref 78.0–100.0)
Monocytes Absolute: 0.7 10*3/uL (ref 0.1–1.0)
Monocytes Relative: 4.9 % (ref 3.0–12.0)
Neutro Abs: 10.2 10*3/uL — ABNORMAL HIGH (ref 1.4–7.7)
Neutrophils Relative %: 67.6 % (ref 43.0–77.0)
Platelets: 293 10*3/uL (ref 150.0–400.0)
RBC: 5.46 Mil/uL — ABNORMAL HIGH (ref 3.87–5.11)
RDW: 13.7 % (ref 11.5–15.5)
WBC: 15.2 10*3/uL — ABNORMAL HIGH (ref 4.0–10.5)

## 2020-09-28 LAB — LDL CHOLESTEROL, DIRECT: Direct LDL: 97 mg/dL

## 2020-09-28 LAB — TSH: TSH: 2.21 u[IU]/mL (ref 0.35–4.50)

## 2020-09-28 MED ORDER — FENOFIBRATE 145 MG PO TABS: 145.0000 mg | ORAL_TABLET | Freq: Every day | ORAL | 1 refills | Status: DC

## 2020-09-28 MED ORDER — MELOXICAM 15 MG PO TABS: ORAL_TABLET | ORAL | 2 refills | Status: DC

## 2020-09-28 MED ORDER — METAXALONE 800 MG PO TABS: 800.0000 mg | ORAL_TABLET | Freq: Four times a day (QID) | ORAL | 2 refills | Status: DC | PRN

## 2020-09-28 MED ORDER — METFORMIN HCL 500 MG PO TABS: 500.0000 mg | ORAL_TABLET | Freq: Two times a day (BID) | ORAL | 1 refills | Status: DC

## 2020-09-28 MED ORDER — RIZATRIPTAN BENZOATE 10 MG PO TABS: 10.0000 mg | ORAL_TABLET | ORAL | 6 refills | Status: DC | PRN

## 2020-09-28 NOTE — Patient Instructions (Signed)
Health Maintenance, Female Adopting a healthy lifestyle and getting preventive care are important in promoting health and wellness. Ask your health care provider about:  The right schedule for you to have regular tests and exams.  Things you can do on your own to prevent diseases and keep yourself healthy. What should I know about diet, weight, and exercise? Eat a healthy diet  Eat a diet that includes plenty of vegetables, fruits, low-fat dairy products, and lean protein.  Do not eat a lot of foods that are high in solid fats, added sugars, or sodium.   Maintain a healthy weight Body mass index (BMI) is used to identify weight problems. It estimates body fat based on height and weight. Your health care provider can help determine your BMI and help you achieve or maintain a healthy weight. Get regular exercise Get regular exercise. This is one of the most important things you can do for your health. Most adults should:  Exercise for at least 150 minutes each week. The exercise should increase your heart rate and make you sweat (moderate-intensity exercise).  Do strengthening exercises at least twice a week. This is in addition to the moderate-intensity exercise.  Spend less time sitting. Even light physical activity can be beneficial. Watch cholesterol and blood lipids Have your blood tested for lipids and cholesterol at 42 years of age, then have this test every 5 years. Have your cholesterol levels checked more often if:  Your lipid or cholesterol levels are high.  You are older than 42 years of age.  You are at high risk for heart disease. What should I know about cancer screening? Depending on your health history and family history, you may need to have cancer screening at various ages. This may include screening for:  Breast cancer.  Cervical cancer.  Colorectal cancer.  Skin cancer.  Lung cancer. What should I know about heart disease, diabetes, and high blood  pressure? Blood pressure and heart disease  High blood pressure causes heart disease and increases the risk of stroke. This is more likely to develop in people who have high blood pressure readings, are of African descent, or are overweight.  Have your blood pressure checked: ? Every 3-5 years if you are 18-39 years of age. ? Every year if you are 40 years old or older. Diabetes Have regular diabetes screenings. This checks your fasting blood sugar level. Have the screening done:  Once every three years after age 40 if you are at a normal weight and have a low risk for diabetes.  More often and at a younger age if you are overweight or have a high risk for diabetes. What should I know about preventing infection? Hepatitis B If you have a higher risk for hepatitis B, you should be screened for this virus. Talk with your health care provider to find out if you are at risk for hepatitis B infection. Hepatitis C Testing is recommended for:  Everyone born from 1945 through 1965.  Anyone with known risk factors for hepatitis C. Sexually transmitted infections (STIs)  Get screened for STIs, including gonorrhea and chlamydia, if: ? You are sexually active and are younger than 42 years of age. ? You are older than 42 years of age and your health care provider tells you that you are at risk for this type of infection. ? Your sexual activity has changed since you were last screened, and you are at increased risk for chlamydia or gonorrhea. Ask your health care provider   if you are at risk.  Ask your health care provider about whether you are at high risk for HIV. Your health care provider may recommend a prescription medicine to help prevent HIV infection. If you choose to take medicine to prevent HIV, you should first get tested for HIV. You should then be tested every 3 months for as long as you are taking the medicine. Pregnancy  If you are about to stop having your period (premenopausal) and  you may become pregnant, seek counseling before you get pregnant.  Take 400 to 800 micrograms (mcg) of folic acid every day if you become pregnant.  Ask for birth control (contraception) if you want to prevent pregnancy. Osteoporosis and menopause Osteoporosis is a disease in which the bones lose minerals and strength with aging. This can result in bone fractures. If you are 65 years old or older, or if you are at risk for osteoporosis and fractures, ask your health care provider if you should:  Be screened for bone loss.  Take a calcium or vitamin D supplement to lower your risk of fractures.  Be given hormone replacement therapy (HRT) to treat symptoms of menopause. Follow these instructions at home: Lifestyle  Do not use any products that contain nicotine or tobacco, such as cigarettes, e-cigarettes, and chewing tobacco. If you need help quitting, ask your health care provider.  Do not use street drugs.  Do not share needles.  Ask your health care provider for help if you need support or information about quitting drugs. Alcohol use  Do not drink alcohol if: ? Your health care provider tells you not to drink. ? You are pregnant, may be pregnant, or are planning to become pregnant.  If you drink alcohol: ? Limit how much you use to 0-1 drink a day. ? Limit intake if you are breastfeeding.  Be aware of how much alcohol is in your drink. In the [place] one drink equals one 12 oz bottle of beer (355 mL), one 5 oz glass of wine (148 mL), or one 1 oz glass of hard liquor (44 mL). General instructions  Schedule regular health, dental, and eye exams.  Stay current with your vaccines.  Tell your health care provider if: ? You often feel depressed. ? You have ever been abused or do not feel safe at home. Summary  Adopting a healthy lifestyle and getting preventive care are important in promoting health and wellness.  Follow your health care provider's instructions about healthy  diet, exercising, and getting tested or screened for diseases.  Follow your health care provider's instructions on monitoring your cholesterol and blood pressure. This information is not intended to replace advice given to you by your health care provider. Make sure you discuss any questions you have with your health care provider. Document Revised: 04/14/2018 Document Reviewed: 04/14/2018 Elsevier Patient Education  2021 [employer] Inc.  

## 2020-09-28 NOTE — Addendum Note (Signed)
Addended by: Emi Holes D on: 09/28/2020 11:29 AM   Modules accepted: Orders

## 2020-09-28 NOTE — Progress Notes (Signed)
Office Note 09/28/2020  CC:  Chief Complaint  Patient presents with  . Follow-up    DM, HLD. Pt is not fasting   HPI:  Jenna Taylor is a 40 y.o. White female who is here for annual health maintenance exam and f/u DM 2, hypertriglyceridemia, and vit D deficiency. I last saw her for f/u on 08/12/18. A/P as of that visit: "1) DM 2: diet erratic, mostly due to work schedule and difficulty finding healthy food choices during work hours. It is doubtful that she is have "low sugar" when she takes her 2nd metformin and eats low carbs. We'll see how her HbA1c looks. Also checking BMET and FLP. She'll keep trying to get in with a nutritionist with [facility] system b/c this is more cost effective for her. Congratulated her on her purposeful wt loss of 20 lb since I last saw her.  2) Anxiety: doing well off of citalopram and she is using lorazepam VERY infrequently.  3) Vit d deficiency: she takes otc vit D (she could not recall the dose) but only erratically. She did take 50K q week replacement dose in the past. Check vit D level today."  INTERIM HX: Doing fairly well.  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 No signif exercise. Trying to avoid starches, drink more water.  Avoids fast food. NOt much sweet foods or snack foods.  Eats 3 meals a day.    DM: was on metformin but fairly noncompliant d/t making the med last a long time b/c didn't f/u for refills as appropriate. No home glucose monitoring.  Triglycerides were 708 on 08/22/20 and I started her on fenofibrate. She tolerated the med, just a bit more stooling than normal---tolerable.  Vit D level low 08/23/18 and I recommended she start 2000 U vit D supplement daily. Pt estimates she takes about 10K units/week (5000U tab)   PMP AWARE reviewed today: no data/rx's present     Past Medical History:  Diagnosis Date  . Anxiety   . Cervical spondylosis   . Compression fracture of thoracic vertebra (HCC)    Mild; T12  .  Degenerative disc disease    Cervical, thoracic, lumbar  . Depression    Situational  . DM (diabetes mellitus), type 2 (HCC) 05/2017   new dx 05/14/17---nonfasting gluc >200, HbA1c 7.6%.  . Eclampsia    Seizure 1 wk after delivery of 3rd child.  Marland Kitchen GERD (gastroesophageal reflux disease)   . Helicobacter pylori gastritis 05/2017   Dx'd by clinical presentation + H pylori IgG positive.  . Hemorrhoids   . History of chronic sinusitis 2012  . History of vitamin D deficiency    Pt says she took replacement dosing and then daily maintenance but no vit D level was rechecked  . Hx of parotitis 10/2016   Left sided: dx'd at ED in [facility], rx'd keflex and flagyl and recovered uneventfully.  . Hypertriglyceridemia 08/2018   gemfibrozil recommended  . Lumbar spondylosis   . Migraine    Quiescent since her mid 63s.  Resurgence 2018  . Ruptured cervical disc   . 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 Lovell Sheehan.    Past Surgical History:  Procedure Laterality Date  . CHOLECYSTECTOMY N/A 07/05/2015   Procedure: LAPAROSCOPIC CHOLECYSTECTOMY WITH INTRAOPERATIVE CHOLANGIOGRAM;  Surgeon: Manus Rudd, MD;  Location: [facility] OR;  Service: General;  Laterality: N/A;  .  dexa  05/2017   Normal  . DILATION AND EVACUATION  1999  . WISDOM TOOTH EXTRACTION      Family History  Problem Relation Age of Onset  . Depression Mother   . COPD Father   . Heart disease Father   . Clotting disorder Brother   . Osteoporosis Maternal Grandmother   . Cancer Maternal Grandfather   . Kidney disease Maternal Grandfather   . Heart disease Maternal Grandfather   . Arthritis Paternal Grandmother   . Cancer Paternal Grandmother   . Cancer Paternal Grandfather   . Breast cancer Neg Hx     Social History   Socioeconomic History  . Marital status: Single    Spouse name: Not on file  . Number of  children: Not on file  . Years of education: Not on file  . Highest education level: Not on file  Occupational History  . Not on file  Tobacco Use  . Smoking status: Former Smoker    Packs/day: 0.50    Types: Cigarettes    Quit date: 05/15/2016    Years since quitting: 4.3  . Smokeless tobacco: Never Used  Substance and Sexual Activity  . Alcohol use: Yes    Comment: socially  . Drug use: No  . Sexual activity: Yes    Birth control/protection: I.U.D.  Other Topics Concern  . Not on file  Social History Narrative   Single, 3 children (13 yr, 11 yr, 7 yr).   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   Tobacco: 10 pack-yr hx--quitting with e-cigs as of 07/2013.   Alcohol: rare.     No drugs.   Social Determinants of Health   Financial Resource Strain: Not on file  Food Insecurity: Not on file  Transportation Needs: Not on file  Physical Activity: Not on file  Stress: Not on file  Social Connections: Not on file  Intimate Partner Violence: Not on file    Outpatient Medications Prior to Visit  Medication Sig Dispense Refill  . cholecalciferol (VITAMIN D) 1000 units tablet Take 3,000 Units by mouth daily.    Marland Kitchen levonorgestrel (MIRENA) 20 MCG/24HR IUD 1 each by Intrauterine route once.    . fenofibrate (TRICOR) 145 MG tablet Take 1 tablet (145 mg total) by mouth daily. 30 tablet 0  . metFORMIN (GLUCOPHAGE) 500 MG tablet Take 1 tablet (500 mg total) by mouth 2 (two) times daily with a meal. 60 tablet 0  . fluticasone (FLONASE) 50 MCG/ACT nasal spray Place 2 sprays into both nostrils daily. (Patient not taking: No sig reported) 16 g 12  . HYDROcodone-acetaminophen (NORCO/VICODIN) 5-325 MG tablet Take 1-2 tablets by mouth every 6 (six) hours as needed.  (Patient not taking: No sig reported)    . LORazepam (ATIVAN) 0.5 MG tablet 1-2 tabs po tid prn anxiety (Patient not taking: No sig reported) 30 tablet 1  . mupirocin ointment (BACTROBAN) 2 % Apply 1 application  topically 3 (three) times daily. (Patient not taking: Reported on 09/28/2020) 15 g 0  . omeprazole (PRILOSEC OTC) 20 MG tablet Take 40 mg by mouth daily. (Patient not taking: No sig reported)    . ondansetron (ZOFRAN) 8 MG tablet Take 1 tablet (8 mg total) by mouth every 8 (eight) hours as needed for nausea or vomiting. (Patient not taking: No sig reported) 30 tablet 0  . oxyCODONE (OXY IR/ROXICODONE) 5 MG immediate release tablet Take 1-2 tablets every 6 hours as needed (Patient not taking: No sig  reported)    . meloxicam (MOBIC) 15 MG tablet 1 tap po qd prn--take with food (Patient not taking: No sig reported) 30 tablet 2  . metaxalone (SKELAXIN) 800 MG tablet Take 1 tablet (800 mg total) by mouth 4 (four) times daily as needed. (Patient not taking: Reported on 09/28/2020) 120 tablet 2  . rizatriptan (MAXALT) 10 MG tablet Take 1 tablet (10 mg total) by mouth as needed for migraine. May repeat in 2 hours if needed (Patient not taking: No sig reported) 10 tablet 6   No facility-administered medications prior to visit.    Allergies  Allergen Reactions  . Tramadol Other (See Comments)    Per pt - unable describe feeling, just not well  . Penicillins Rash    Has patient had a PCN reaction causing immediate rash, facial/tongue/throat swelling, SOB or lightheadedness with hypotension: unknown, childhood reaction    ROS Review of Systems  Constitutional: Negative for appetite change, chills, fatigue and fever.  HENT: Negative for congestion, dental problem, ear pain and sore throat.   Eyes: Negative for discharge, redness and visual disturbance.  Respiratory: Negative for cough, chest tightness, shortness of breath and wheezing.   Cardiovascular: Positive for palpitations. Negative for chest pain and leg swelling.  Gastrointestinal: Negative for abdominal pain, blood in stool, diarrhea, nausea and vomiting.  Genitourinary: Negative for difficulty urinating, dysuria, flank pain, frequency,  hematuria and urgency.  Musculoskeletal: Positive for back pain (chronic) and neck pain (chronic). Negative for arthralgias, joint swelling, myalgias and neck stiffness.  Skin: Negative for pallor and rash.  Neurological: Negative for dizziness, speech difficulty, weakness and headaches.  Hematological: Negative for adenopathy. Does not bruise/bleed easily.  Psychiatric/Behavioral: Negative for confusion and sleep disturbance. The patient is not nervous/anxious.     PE; Vitals with BMI 09/28/2020 09/20/2020 02/09/2019  Height 5\' 7"  5\' 7"  -  Weight 233 lbs 6 oz 231 lbs 13 oz -  BMI 36.55 36.3 -  Systolic 128 125 -  Diastolic 85 75 -  Pulse 90 90 76    Gen: Alert, well appearing.  Patient is oriented to person, place, time, and situation. AFFECT: pleasant, lucid thought and speech. ENT: Ears: EACs clear, normal epithelium.  TMs with good light reflex and landmarks bilaterally.  Eyes: no injection, icteris, swelling, or exudate.  EOMI, PERRLA. Nose: no drainage or turbinate edema/swelling.  No injection or focal lesion.  Mouth: lips without lesion/swelling.  Oral mucosa pink and moist.  Dentition intact and without obvious caries or gingival swelling.  Oropharynx without erythema, exudate, or swelling.  Neck: supple/nontender.  No LAD, mass, or TM.  Carotid pulses 2+ bilaterally, without bruits. CV: RRR, no m/r/g.   LUNGS: CTA bilat, nonlabored resps, good aeration in all lung fields. ABD: soft, NT, ND, BS normal.  No hepatospenomegaly or mass.  No bruits. EXT: no clubbing or cyanosis.  1+ bilat LL pitting edema Musculoskeletal: no joint swelling, erythema, warmth, or tenderness.  ROM of all joints intact. Skin - no sores or suspicious lesions or rashes or color changes   Pertinent labs:  No results found for: TSH Lab Results  Component Value Date   WBC 9.1 01/13/2018   HGB 15.2 (H) 01/13/2018   HCT 45.7 01/13/2018   MCV 85.0 01/13/2018   PLT 288.0 01/13/2018  No results found for:  IRON, TIBC, FERRITIN No results found for: VITAMINB12 Vit D level on 08/23/18 was 22.  Lab Results  Component Value Date   CREATININE 0.80 08/23/2018   BUN  12 08/23/2018   NA 139 08/23/2018   K 4.6 08/23/2018   CL 102 08/23/2018   CO2 29 08/23/2018   Lab Results  Component Value Date   ALT 20 01/13/2018   AST 16 01/13/2018   ALKPHOS 77 01/13/2018   BILITOT 0.5 01/13/2018   Lab Results  Component Value Date   CHOL 223 (H) 08/23/2018   Lab Results  Component Value Date   HDL 31.90 (L) 08/23/2018   No results found for: [facility] Lab Results  Component Value Date   TRIG (H) 08/23/2018    708.0 Triglyceride is over 400; calculations on Lipids are invalid.   Lab Results  Component Value Date   CHOLHDL 7 08/23/2018   Lab Results  Component Value Date   HGBA1C 6.5 08/23/2018   ASSESSMENT AND PLAN:   It's been [facility] and she has lots of physical complaints that we could not address today, none are urgent/critical/acute.  I told her to schedule separate office visit to address these and she expressed understanding and agreement with this approach.  1) DM 2, noncompliant with metformin but diet sounds pretty fair. No exercise but she realizes this would help her a lot. She cites limitation to to chronic neck and back pain.  2) Hypertriglyceridemia: tolerated fenofibrate but she failed to come back for recheck of her lipids while on this med.  Restart this med and we'll plan on repeat lipids in 2-3 mo.  FLP and hepatic panel obtained today (last ate 5 hours ago).  3) Health maintenance exam: Reviewed age and gender appropriate health maintenance issues (prudent diet, regular exercise, health risks of tobacco and excessive alcohol, use of seatbelts, fire alarms in home, use of sunscreen).  Also reviewed age and gender appropriate health screening as well as vaccine recommendations. Vaccines: Tdap due->given today.   Labs: fasting (she last ate 5 hours  ago) HP, Hba1c, vit D. Cervical ca screening: pt past due to f/u with Dr. Tonna BoehringerMcComb->she'll call his office to arrange appt. Breast ca screening: start annual screening mammograms->ordered today. Colon ca screening: average risk patient= as per latest guidelines, start screening at 6845 yrs of age.  I did RF some additional meds for her today: maxalt, skelaxin, mobic. Regarding her chronic neck and back pain, I told her I have chosen not to manage her pain with opioids and if she wants to pursue this then I'd refer her to pain mgmt clinic. She declined this today.  An After Visit Summary was printed and given to the patient.  FOLLOW UP:  Return for make appt at your convenience for palpitations.  Signed:  Santiago BumpersPhil Laguana Desautel, MD           09/28/2020

## 2020-10-03 ENCOUNTER — Encounter: Payer: Self-pay | Admitting: Family Medicine

## 2020-10-08 ENCOUNTER — Other Ambulatory Visit: Payer: Self-pay

## 2020-10-08 ENCOUNTER — Ambulatory Visit: Payer: Managed Care, Other (non HMO) | Admitting: Family Medicine

## 2020-10-08 ENCOUNTER — Encounter: Payer: Self-pay | Admitting: Family Medicine

## 2020-10-08 VITALS — BP 127/83 | HR 95 | Temp 98.0°F | Resp 16 | Ht 67.0 in | Wt 233.0 lb

## 2020-10-08 DIAGNOSIS — R0789 Other chest pain: Secondary | ICD-10-CM | POA: Diagnosis not present

## 2020-10-08 DIAGNOSIS — R002 Palpitations: Secondary | ICD-10-CM | POA: Diagnosis not present

## 2020-10-08 DIAGNOSIS — D751 Secondary polycythemia: Secondary | ICD-10-CM

## 2020-10-08 NOTE — Progress Notes (Signed)
OFFICE VISIT  10/08/2020  CC:  Chief Complaint  Patient presents with  . Palpitations    Comes and goes; describes as squeezing.    HPI:    Patient is a 40 y.o. Caucasian female who presents for cardiac concerns. Onset of sx's about 12/2019--after getting 1st covid shot. Has felt a few episodes of a quick "shocklike" pain in chest for 1 second. Other times (every few days?) feels a chest tightness sensation on L chest area, could be sititng/standing, walking--doesn't matter.  Waxes and wanes for a couple hours sometimes.  No arm or jaw pain, no nausea or diaphoresis, no SOB, no palpitations.  She is afraid to exercise, plus has chronic back pain.  Once her watch alarm went off showing HR 140s--while in shower--felt no sx's.  Has high resting HR already.    Erythrocytosis on labs 09/28/20 (Hb 15.6).  Hb's have hung around 14-15 over the last several years. I was unable to add iron level onto last labs. Tobacco: yes, approx 1 pack per week, restarted 07/2020.  Had been vaping from 2018-2022.   Pt states she had recurrent bronchitis as a kid but resolved after HS age.  No FH or CAD except maternal GF with CAD (unknown age).  ROS as above, plus--> no fevers, no wheeze,no cough, no dizziness, no HAs, no rashes, no melena/hematochezia.  No polyuria or polydipsia.  No myalgias or arthralgias.  No focal weakness, paresthesias, or tremors.  No acute vision or hearing abnormalities.  No dysuria or unusual/new urinary urgency or frequency.  No recent changes in lower legs. No n/v/d or abd pain.      Past Medical History:  Diagnosis Date  . Anxiety   . Cervical spondylosis   . Compression fracture of thoracic vertebra (HCC)    Mild; T12  . Degenerative disc disease    Cervical, thoracic, lumbar  . Depression    Situational  . DM (diabetes mellitus), type 2 (HCC) 05/2017   new dx 05/14/17---nonfasting gluc >200, HbA1c 7.6%.  . Eclampsia    Seizure 1 wk after delivery of 3rd child.  Marland Kitchen GERD  (gastroesophageal reflux disease)   . Helicobacter pylori gastritis 05/2017   Dx'd by clinical presentation + H pylori IgG positive.  . Hemorrhoids   . History of chronic sinusitis 2012  . Hx of parotitis 10/2016   Left sided: dx'd at ED in [facility], rx'd keflex and flagyl and recovered uneventfully.  . Hypertriglyceridemia 08/2018  . Lumbar spondylosis   . Migraine    Quiescent since her mid 49s.  Resurgence 2018  . Ruptured cervical disc   . 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 Lovell Sheehan.  . Vitamin D deficiency     Past Surgical History:  Procedure Laterality Date  . CHOLECYSTECTOMY N/A 07/05/2015   Procedure: LAPAROSCOPIC CHOLECYSTECTOMY WITH INTRAOPERATIVE CHOLANGIOGRAM;  Surgeon: Manus Rudd, MD;  Location: [facility] OR;  Service: General;  Laterality: N/A;  . dexa  05/2017   Normal  . DILATION AND EVACUATION  1999  . WISDOM TOOTH EXTRACTION      Outpatient Medications Prior to Visit  Medication Sig Dispense Refill  . cholecalciferol (VITAMIN D) 1000 units tablet Take 3,000 Units by mouth daily.    . fenofibrate (TRICOR) 145 MG tablet Take 1 tablet (145 mg total) by mouth daily. 90 tablet 1  . Fexofenadine HCl (ALLEGRA PO) Take by  mouth as needed.    . fluticasone (FLONASE ALLERGY RELIEF) 50 MCG/ACT nasal spray Place into both nostrils daily.    Marland Kitchen levonorgestrel (MIRENA) 20 MCG/24HR IUD 1 each by Intrauterine route once.    Marland Kitchen LORazepam (ATIVAN) 0.5 MG tablet 1-2 tabs po tid prn anxiety 30 tablet 1  . meloxicam (MOBIC) 15 MG tablet 1 tap po qd prn--take with food 30 tablet 2  . metaxalone (SKELAXIN) 800 MG tablet Take 1 tablet (800 mg total) by mouth 4 (four) times daily as needed. 120 tablet 2  . metFORMIN (GLUCOPHAGE) 500 MG tablet Take 1 tablet (500 mg total) by mouth 2 (two) times daily with a meal. 180 tablet 1  . mupirocin ointment (BACTROBAN) 2 % Apply 1  application topically 3 (three) times daily. 15 g 0  . omeprazole (PRILOSEC OTC) 20 MG tablet Take 40 mg by mouth daily.    . ondansetron (ZOFRAN) 8 MG tablet Take 1 tablet (8 mg total) by mouth every 8 (eight) hours as needed for nausea or vomiting. 30 tablet 0  . oxyCODONE (OXY IR/ROXICODONE) 5 MG immediate release tablet Take 1-2 tablets every 6 hours as needed    . rizatriptan (MAXALT) 10 MG tablet Take 1 tablet (10 mg total) by mouth as needed for migraine. May repeat in 2 hours if needed 10 tablet 6  . HYDROcodone-acetaminophen (NORCO/VICODIN) 5-325 MG tablet Take 1-2 tablets by mouth every 6 (six) hours as needed.  (Patient not taking: No sig reported)    . fluticasone (FLONASE) 50 MCG/ACT nasal spray Place 2 sprays into both nostrils daily. (Patient not taking: No sig reported) 16 g 12   No facility-administered medications prior to visit.    Allergies  Allergen Reactions  . Tramadol Other (See Comments)    Per pt - unable describe feeling, just not well  . Penicillins Rash    Has patient had a PCN reaction causing immediate rash, facial/tongue/throat swelling, SOB or lightheadedness with hypotension: unknown, childhood reaction   Social History   Socioeconomic History  . Marital status: Single    Spouse name: Not on file  . Number of children: Not on file  . Years of education: Not on file  . Highest education level: Not on file  Occupational History  . Not on file  Tobacco Use  . Smoking status: Former Smoker    Packs/day: 0.50    Types: Cigarettes    Quit date: 05/15/2016    Years since quitting: 4.4  . Smokeless tobacco: Never Used  Substance and Sexual Activity  . Alcohol use: Yes    Comment: socially  . Drug use: No  . Sexual activity: Yes    Birth control/protection: I.U.D.  Other Topics Concern  . Not on file  Social History Narrative   Single, 3 children (13 yr, 11 yr, 7 yr).   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   Tobacco: 10 pack-yr  hx--quitting with e-cigs as of 07/2013.   Alcohol: rare.     No drugs.   Social Determinants of Health   Financial Resource Strain: Not on file  Food Insecurity: Not on file  Transportation Needs: Not on file  Physical Activity: Not on file  Stress: Not on file  Social Connections: Not on file    ROS As per HPI  PE: Vitals with BMI 10/08/2020 09/28/2020 09/20/2020  Height 5\' 7"  5\' 7"  5\' 7"   Weight 233 lbs 233 lbs 6 oz 231 lbs 13 oz  BMI 36.48 36.55 36.3  Systolic 127 128 852  Diastolic 83 85 75  Pulse 95 90 90   Gen: Alert, well appearing.  Patient is oriented to person, place, time, and situation. AFFECT: pleasant, lucid thought and speech. CV: RRR, no m/r/g.   LUNGS: CTA bilat, nonlabored resps, good aeration in all lung fields. EXT: no clubbing or cyanosis.  no edema.    LABS:    Chemistry      Component Value Date/Time   NA 138 09/28/2020 1129   K 4.0 09/28/2020 1129   CL 102 09/28/2020 1129   CO2 25 09/28/2020 1129   BUN 10 09/28/2020 1129   CREATININE 0.82 09/28/2020 1129      Component Value Date/Time   CALCIUM 9.3 09/28/2020 1129   ALKPHOS 66 09/28/2020 1129   AST 13 09/28/2020 1129   ALT 17 09/28/2020 1129   BILITOT 0.4 09/28/2020 1129     Lab Results  Component Value Date   WBC 15.2 (H) 09/28/2020   HGB 15.6 (H) 09/28/2020   HCT 46.2 (H) 09/28/2020   MCV 84.6 09/28/2020   PLT 293.0 09/28/2020   Lab Results  Component Value Date   TSH 2.21 09/28/2020   Lab Results  Component Value Date   CHOL 197 09/28/2020   HDL 31.30 (L) 09/28/2020   LDLDIRECT 97.0 09/28/2020   TRIG (H) 09/28/2020    456.0 Triglyceride is over 400; calculations on Lipids are invalid.   CHOLHDL 6 09/28/2020   Lab Results  Component Value Date   HGBA1C 7.9 (H) 09/28/2020   12 lead EKG today: (no prior for comparison) NSR, rate 87, no ischemia, no ectopy. Normal intervals and duration.  IMPRESSION AND PLAN:  1) Atypical chest pain, intermediate risk for  CAD. Coronary RF's: DM, HLD, tobacco abuse. EKG normal today. Check x-ray of chest. Ordered myocardial perfusion imaging (she cannot run on treadmill d/t back pain).    An After Visit Summary was printed and given to the patient.  FOLLOW UP: Return in about 3 months (around 01/08/2021) for routine chronic illness f/u.  Signed:  Santiago Bumpers, MD           10/08/2020

## 2020-11-06 ENCOUNTER — Encounter (HOSPITAL_COMMUNITY): Payer: Managed Care, Other (non HMO)

## 2020-11-07 ENCOUNTER — Ambulatory Visit (HOSPITAL_COMMUNITY): Payer: Managed Care, Other (non HMO)

## 2020-11-08 ENCOUNTER — Telehealth (HOSPITAL_COMMUNITY): Payer: Self-pay | Admitting: *Deleted

## 2020-11-08 NOTE — Telephone Encounter (Signed)
Close encounter 

## 2020-11-13 ENCOUNTER — Encounter: Payer: Self-pay | Admitting: Family Medicine

## 2020-11-14 ENCOUNTER — Other Ambulatory Visit: Payer: Self-pay

## 2020-11-14 ENCOUNTER — Ambulatory Visit (HOSPITAL_COMMUNITY)
Admission: RE | Admit: 2020-11-14 | Discharge: 2020-11-14 | Disposition: A | Discharge status: Home / Self Care | Payer: Managed Care, Other (non HMO) | Source: Ambulatory Visit | Attending: Cardiovascular Disease | Admitting: Cardiovascular Disease

## 2020-11-14 DIAGNOSIS — R0789 Other chest pain: Secondary | ICD-10-CM | POA: Diagnosis not present

## 2020-11-14 HISTORY — PX: CARDIOVASCULAR STRESS TEST: SHX262

## 2020-11-14 LAB — MYOCARDIAL PERFUSION IMAGING
LV dias vol: 87 mL (ref 46–106)
LV sys vol: 39 mL
Peak HR: 118 {beats}/min
Rest HR: 86 {beats}/min
SDS: 0
SRS: 0
SSS: 0
TID: 1.1

## 2020-11-14 MED ORDER — TECHNETIUM TC 99M TETROFOSMIN IV KIT
29.0000 | PACK | Freq: Once | INTRAVENOUS | Status: AC | PRN
  Administered 2020-11-14: 29 via INTRAVENOUS
  Filled 2020-11-14: qty 29

## 2020-11-14 MED ORDER — TECHNETIUM TC 99M TETROFOSMIN IV KIT
9.7000 | PACK | Freq: Once | INTRAVENOUS | Status: AC | PRN
  Administered 2020-11-14: 9.7 via INTRAVENOUS
  Filled 2020-11-14: qty 10

## 2020-11-14 MED ORDER — REGADENOSON 0.4 MG/5ML IV SOLN
0.4000 mg | Freq: Once | INTRAVENOUS | Status: AC
  Administered 2020-11-14: 0.4 mg via INTRAVENOUS

## 2020-11-15 ENCOUNTER — Encounter: Payer: Self-pay | Admitting: Family Medicine

## 2020-11-20 ENCOUNTER — Inpatient Hospital Stay (HOSPITAL_BASED_OUTPATIENT_CLINIC_OR_DEPARTMENT_OTHER): Admission: RE | Admit: 2020-11-20 | Payer: Managed Care, Other (non HMO) | Source: Ambulatory Visit

## 2020-12-17 ENCOUNTER — Ambulatory Visit (HOSPITAL_BASED_OUTPATIENT_CLINIC_OR_DEPARTMENT_OTHER): Payer: Managed Care, Other (non HMO)

## 2021-03-16 ENCOUNTER — Other Ambulatory Visit: Payer: Self-pay | Admitting: Family Medicine

## 2021-04-01 ENCOUNTER — Other Ambulatory Visit: Payer: Self-pay

## 2021-04-02 ENCOUNTER — Encounter: Payer: Self-pay | Admitting: Family Medicine

## 2021-04-02 ENCOUNTER — Ambulatory Visit (INDEPENDENT_AMBULATORY_CARE_PROVIDER_SITE_OTHER): Payer: Managed Care, Other (non HMO) | Admitting: Family Medicine

## 2021-04-02 VITALS — BP 115/81 | HR 92 | Temp 98.2°F | Ht 67.0 in | Wt 227.6 lb

## 2021-04-02 DIAGNOSIS — R600 Localized edema: Secondary | ICD-10-CM

## 2021-04-02 DIAGNOSIS — M79675 Pain in left toe(s): Secondary | ICD-10-CM

## 2021-04-02 DIAGNOSIS — E119 Type 2 diabetes mellitus without complications: Secondary | ICD-10-CM

## 2021-04-02 LAB — COMPREHENSIVE METABOLIC PANEL
ALT: 21 U/L (ref 0–35)
AST: 17 U/L (ref 0–37)
Albumin: 4.3 g/dL (ref 3.5–5.2)
Alkaline Phosphatase: 56 U/L (ref 39–117)
BUN: 8 mg/dL (ref 6–23)
CO2: 25 mEq/L (ref 19–32)
Calcium: 9.6 mg/dL (ref 8.4–10.5)
Chloride: 101 mEq/L (ref 96–112)
Creatinine, Ser: 0.85 mg/dL (ref 0.40–1.20)
GFR: 84.7 mL/min (ref >60.00)
Glucose, Bld: 104 mg/dL — ABNORMAL HIGH (ref 70–99)
Potassium: 3.8 mEq/L (ref 3.5–5.1)
Sodium: 136 mEq/L (ref 135–145)
Total Bilirubin: 0.6 mg/dL (ref 0.2–1.2)
Total Protein: 7.3 g/dL (ref 6.0–8.3)

## 2021-04-02 LAB — LIPID PANEL
Cholesterol: 198 mg/dL (ref 0–200)
HDL: 44.8 mg/dL (ref >39.00)
NonHDL: 152.79
Total CHOL/HDL Ratio: 4
Triglycerides: 302 mg/dL — ABNORMAL HIGH (ref 0.0–149.0)
VLDL: 60.4 mg/dL — ABNORMAL HIGH (ref 0.0–40.0)

## 2021-04-02 LAB — HEMOGLOBIN A1C: Hgb A1c MFr Bld: 7.4 % — ABNORMAL HIGH (ref 4.6–6.5)

## 2021-04-02 LAB — LDL CHOLESTEROL, DIRECT: Direct LDL: 115 mg/dL

## 2021-04-02 LAB — SEDIMENTATION RATE: Sed Rate: 20 mm/hr (ref 0–20)

## 2021-04-02 LAB — URIC ACID: Uric Acid, Serum: 5.3 mg/dL (ref 2.4–7.0)

## 2021-04-02 MED ORDER — PREDNISONE 20 MG PO TABS: ORAL_TABLET | ORAL | 0 refills | Status: DC

## 2021-04-02 NOTE — Progress Notes (Signed)
OFFICE VISIT  04/02/2021  CC:  Chief Complaint  Patient presents with   Foot Swelling    Left; pt also c/o pain x 6 days. Has not taken any meds for this. Pain worsens with movement but has been elevating at home and at work as much as possible.    HPI:    Patient is a 40 y.o. female who presents for left foot pain.  INTERIM HX: About 1 week ago she woke up with both lower legs and feet swelling.  She says it was pitting. Around the same time she also felt pain in the left foot along the first MTP joint region extending up under the arch medially.  No redness or swelling of the toe itself, just lower legs and feet in general. No significant change in sodium intake prior.  She has been sitting more for her job for long periods. No pain in the calves. Other than her toe, no joints are bothering her. She has not been taking any over-the-counter medicines for pain or inflammation. No history of gout.  She does have diabetes, no home glucose monitoring.  Takes metformin 500 mg twice a day.  ROS as above, plus--> no fevers, no CP, no SOB, no wheezing, no cough, no dizziness, no HAs, no rashes, no melena/hematochezia.  No polyuria or polydipsia.  No myalgias.  No focal weakness, paresthesias, or tremors.  No acute vision or hearing abnormalities.  No dysuria or unusual/new urinary urgency or frequency.   No n/v/d or abd pain.  No palpitations.     Past Medical History:  Diagnosis Date   Anxiety    Cervical spondylosis    Compression fracture of thoracic vertebra (HCC)    Mild; T12   Degenerative disc disease    Cervical, thoracic, lumbar   Depression    Situational   DM (diabetes mellitus), type 2 (HCC) 05/2017   new dx 05/14/17---nonfasting gluc >200, HbA1c 7.6%.   Eclampsia    Seizure 1 wk after delivery of 3rd child.   GERD (gastroesophageal reflux disease)    Helicobacter pylori gastritis 05/2017   Dx'd by clinical presentation + H pylori IgG positive.   Hemorrhoids     History of chronic sinusitis 2012   Hx of parotitis 10/2016   Left sided: dx'd at ED in [facility], rx'd keflex and flagyl and recovered uneventfully.   Hypertriglyceridemia 08/2018   Lumbar spondylosis    Migraine    Quiescent since her mid 37s.  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 Lovell Sheehan.   Vitamin D deficiency     Past Surgical History:  Procedure Laterality Date   CARDIOVASCULAR STRESS TEST  11/14/2020   NORMAL/LOW RISK   CHOLECYSTECTOMY N/A 07/05/2015   Procedure: LAPAROSCOPIC CHOLECYSTECTOMY WITH INTRAOPERATIVE CHOLANGIOGRAM;  Surgeon: Manus Rudd, MD;  Location: [facility] OR;  Service: General;  Laterality: N/A;   dexa  05/2017   Normal   DILATION AND EVACUATION  1999   WISDOM TOOTH EXTRACTION      Outpatient Medications Prior to Visit  Medication Sig Dispense Refill   cholecalciferol (VITAMIN D) 1000 units tablet Take 3,000 Units by mouth daily.     fenofibrate (TRICOR) 145 MG tablet Take 1 tablet (145 mg total) by mouth daily. 90 tablet 1   Fexofenadine HCl (ALLEGRA PO) Take by mouth as needed.  fluticasone (FLONASE) 50 MCG/ACT nasal spray Place into both nostrils daily.     levonorgestrel (MIRENA) 20 MCG/24HR IUD 1 each by Intrauterine route once.     LORazepam (ATIVAN) 0.5 MG tablet 1-2 tabs po tid prn anxiety 30 tablet 1   meloxicam (MOBIC) 15 MG tablet TAKE 1 TABLET BY MOUTH ONCE DAILY AS NEEDED WITH FOOD 30 tablet 0   metaxalone (SKELAXIN) 800 MG tablet Take 1 tablet (800 mg total) by mouth 4 (four) times daily as needed. 120 tablet 2   metFORMIN (GLUCOPHAGE) 500 MG tablet TAKE 1 TABLET BY MOUTH TWICE DAILY WITH A MEAL 180 tablet 0   mupirocin ointment (BACTROBAN) 2 % Apply 1 application topically 3 (three) times daily. 15 g 0   omeprazole (PRILOSEC OTC) 20 MG tablet Take 40 mg by mouth daily.      ondansetron (ZOFRAN) 8 MG tablet Take 1 tablet (8 mg total) by mouth every 8 (eight) hours as needed for nausea or vomiting. 30 tablet 0   oxyCODONE (OXY IR/ROXICODONE) 5 MG immediate release tablet Take 1-2 tablets every 6 hours as needed     rizatriptan (MAXALT) 10 MG tablet Take 1 tablet (10 mg total) by mouth as needed for migraine. May repeat in 2 hours if needed 10 tablet 6   HYDROcodone-acetaminophen (NORCO/VICODIN) 5-325 MG tablet Take 1-2 tablets by mouth every 6 (six) hours as needed.  (Patient not taking: Reported on 09/20/2020)     No facility-administered medications prior to visit.    Allergies  Allergen Reactions   Tramadol Other (See Comments)    Per pt - unable describe feeling, just not well   Penicillins Rash    Has patient had a PCN reaction causing immediate rash, facial/tongue/throat swelling, SOB or lightheadedness with hypotension: unknown, childhood reaction    ROS As per HPI  PE: Vitals with BMI 04/02/2021 11/14/2020 10/08/2020  Height 5\' 7"  5\' 7"  5\' 7"   Weight 227 lbs 10 oz 233 lbs 233 lbs  BMI 35.64 36.48 36.48  Systolic 115 - 127  Diastolic 81 - 83  Pulse 92 - 95     Gen: Alert, well appearing.  Patient is oriented to person, place, time, and situation. AFFECT: pleasant, lucid thought and speech. Both lower legs without any pitting edema or notable swelling.  No calf tenderness.  Measured 10 cm below the inferior border of the patella her calves both measure 40.5 cm circumference.  No varicose veins or skin changes She has some tenderness to palpation at the distal aspect of the first metatarsal on the plantar surface no erythema or excessive warmth of the MTP.  She has normal range of motion of all toes.  No significant arch tenderness.  Dorsalis pedis and posterior tibial pulses normal bilaterally.  LABS:    Chemistry      Component Value Date/Time   NA 138 09/28/2020 1129   K 4.0 09/28/2020 1129   CL 102 09/28/2020 1129   CO2 25 09/28/2020 1129    BUN 10 09/28/2020 1129   CREATININE 0.82 09/28/2020 1129      Component Value Date/Time   CALCIUM 9.3 09/28/2020 1129   ALKPHOS 66 09/28/2020 1129   AST 13 09/28/2020 1129   ALT 17 09/28/2020 1129   BILITOT 0.4 09/28/2020 1129     No results found for: [facility] Lab Results  Component Value Date   HGBA1C 7.9 (H) 09/28/2020   Lab Results  Component Value Date   CHOL 197 09/28/2020   HDL 31.30 (  L) 09/28/2020   LDLDIRECT 97.0 09/28/2020   TRIG (H) 09/28/2020    456.0 Triglyceride is over 400; calculations on Lipids are invalid.   CHOLHDL 6 09/28/2020   Lab Results  Component Value Date   TSH 2.21 09/28/2020   IMPRESSION AND PLAN:  #1 nonspecific fluid retention.  Although she feels like this is still in her legs she has no edema on exam today.  Reassured.  Encouraged more walking during her day.  She has compression stockings to wear if needed.  Encouraged ongoing scrutiny of sodium in her diet. She is concerned that she drinks fluids adequately but does not feel like she urinates nearly enough. BMET today.  #2 left great toe pain.  This extends up into the arch and her exam is more consistent with possible metatarsal head pain.  However she has no history of increased activity, and this seemed to come out of the blue spontaneously, indicating more of a possible gout picture. Bedside ultrasound today showed normal distal first metatarsal, normal first MTP joint--no increased vascularity. Decided to try a prednisone burst of 40 mg a day x5 days.  Checking uric acid level and sed rate today. If not improved at follow-up in 1 week then could consider trial of MTP joint steroid injection +/- x-ray.  #3 diabetes type 2.  Compliant with metformin 500 twice daily. Will check A1c today as well as lipid panel.  She has had a granola bar and a few sips of coffee with creamer today.   An After Visit Summary was printed and given to the patient.  FOLLOW UP: Return in about 1 week (around  04/09/2021) for f/u toe pain.  Signed:  Santiago Bumpers, MD           04/02/2021

## 2021-04-10 ENCOUNTER — Encounter: Payer: Self-pay | Admitting: Family Medicine

## 2021-04-10 ENCOUNTER — Other Ambulatory Visit: Payer: Self-pay

## 2021-04-10 ENCOUNTER — Ambulatory Visit: Payer: Managed Care, Other (non HMO) | Admitting: Family Medicine

## 2021-04-10 VITALS — BP 122/72 | HR 99 | Temp 98.0°F | Ht 67.0 in | Wt 230.0 lb

## 2021-04-10 DIAGNOSIS — E119 Type 2 diabetes mellitus without complications: Secondary | ICD-10-CM | POA: Diagnosis not present

## 2021-04-10 DIAGNOSIS — M79675 Pain in left toe(s): Secondary | ICD-10-CM

## 2021-04-10 DIAGNOSIS — E781 Pure hyperglyceridemia: Secondary | ICD-10-CM

## 2021-04-10 NOTE — Progress Notes (Signed)
OFFICE VISIT  04/10/2021  CC:  Chief Complaint  Patient presents with   Follow-up    Toe pain    HPI:    Patient is a 40 y.o. female who presents for 1 wk f/u L great toe pain. A/P as of last visit: "#1 nonspecific fluid retention.  Although she feels like this is still in her legs she has no edema on exam today.  Reassured.  Encouraged more walking during her day.  She has compression stockings to wear if needed.  Encouraged ongoing scrutiny of sodium in her diet. She is concerned that she drinks fluids adequately but does not feel like she urinates nearly enough. BMET today.   #2 left great toe pain.  This extends up into the arch and her exam is more consistent with possible metatarsal head pain.  However she has no history of increased activity, and this seemed to come out of the blue spontaneously, indicating more of a possible gout picture. Bedside ultrasound today showed normal distal first metatarsal, normal first MTP joint--no increased vascularity. Decided to try a prednisone burst of 40 mg a day x5 days.  Checking uric acid level and sed rate today. If not improved at follow-up in 1 week then could consider trial of MTP joint steroid injection +/- x-ray.   #3 diabetes type 2.  Compliant with metformin 500 twice daily. Will check A1c today as well as lipid panel.  She has had a granola bar and a few sips of coffee with creamer today."  INTERIM HX: Patient's toe pain is gone.  The steroids did make her have insomnia and cognitive clouding but she was able to complete them. Still feels like lower legs are somewhat swollen.  Hba1c 7.4% and trigs 302 on labs last week, otherwise normal. It turns out that she had only been taking the metformin twice daily for about the last 2 weeks.  Prior to that she felt like she could only take it once a day because twice a day made her feel cognitive clouding.  Since getting back on twice a day at this time she does not feel the symptoms,  other than when she just was on prednisone. She also had been off of her fenofibrate.  She initially felt like this medication was causing some diarrhea and/or constipation.  However getting back on it a few weeks ago has not resulted in any problems.  Past Medical History:  Diagnosis Date   Anxiety    Cervical spondylosis    Compression fracture of thoracic vertebra (HCC)    Mild; T12   Degenerative disc disease    Cervical, thoracic, lumbar   Depression    Situational   DM (diabetes mellitus), type 2 (HCC) 05/2017   new dx 05/14/17---nonfasting gluc >200, HbA1c 7.6%.   Eclampsia    Seizure 1 wk after delivery of 3rd child.   GERD (gastroesophageal reflux disease)    Helicobacter pylori gastritis 05/2017   Dx'd by clinical presentation + H pylori IgG positive.   Hemorrhoids    History of chronic sinusitis 2012   Hx of parotitis 10/2016   Left sided: dx'd at ED in [facility], rx'd keflex and flagyl and recovered uneventfully.   Hypertriglyceridemia 08/2018   Lumbar spondylosis    Migraine    Quiescent since her mid 25s.  Resurgence 2018   Ruptured cervical disc    Syrinx of spinal cord (HCC)    Small thoracic syrinx--"stable over multiple years with multiple MRIs", "no need  to worry about any further" per NS, Dr. Lovell Sheehan.   Vitamin D deficiency     Past Surgical History:  Procedure Laterality Date   CARDIOVASCULAR STRESS TEST  11/14/2020   NORMAL/LOW RISK   CHOLECYSTECTOMY N/A 07/05/2015   Procedure: LAPAROSCOPIC CHOLECYSTECTOMY WITH INTRAOPERATIVE CHOLANGIOGRAM;  Surgeon: Manus Rudd, MD;  Location: [facility] OR;  Service: General;  Laterality: N/A;   dexa  05/2017   Normal   DILATION AND EVACUATION  1999   WISDOM TOOTH EXTRACTION      Outpatient Medications Prior to Visit  Medication Sig Dispense Refill   cholecalciferol (VITAMIN D) 1000 units tablet Take 3,000 Units by mouth daily.     fenofibrate (TRICOR) 145 MG tablet Take 1 tablet (145 mg total) by mouth daily. 90  tablet 1   Fexofenadine HCl (ALLEGRA PO) Take by mouth as needed.     fluticasone (FLONASE) 50 MCG/ACT nasal spray Place into both nostrils daily.     levonorgestrel (MIRENA) 20 MCG/24HR IUD 1 each by Intrauterine route once.     LORazepam (ATIVAN) 0.5 MG tablet 1-2 tabs po tid prn anxiety 30 tablet 1   meloxicam (MOBIC) 15 MG tablet TAKE 1 TABLET BY MOUTH ONCE DAILY AS NEEDED WITH FOOD 30 tablet 0   metaxalone (SKELAXIN) 800 MG tablet Take 1 tablet (800 mg total) by mouth 4 (four) times daily as needed. 120 tablet 2   metFORMIN (GLUCOPHAGE) 500 MG tablet TAKE 1 TABLET BY MOUTH TWICE DAILY WITH A MEAL 180 tablet 0   mupirocin ointment (BACTROBAN) 2 % Apply 1 application topically 3 (three) times daily. 15 g 0   omeprazole (PRILOSEC OTC) 20 MG tablet Take 40 mg by mouth daily.     ondansetron (ZOFRAN) 8 MG tablet Take 1 tablet (8 mg total) by mouth every 8 (eight) hours as needed for nausea or vomiting. 30 tablet 0   rizatriptan (MAXALT) 10 MG tablet Take 1 tablet (10 mg total) by mouth as needed for migraine. May repeat in 2 hours if needed 10 tablet 6   oxyCODONE (OXY IR/ROXICODONE) 5 MG immediate release tablet Take 1-2 tablets every 6 hours as needed     predniSONE (DELTASONE) 20 MG tablet 2 tabs po qd x 5d 10 tablet 0   HYDROcodone-acetaminophen (NORCO/VICODIN) 5-325 MG tablet Take 1-2 tablets by mouth every 6 (six) hours as needed.  (Patient not taking: Reported on 09/20/2020)     No facility-administered medications prior to visit.    Allergies  Allergen Reactions   Tramadol Other (See Comments)    Per pt - unable describe feeling, just not well   Penicillins Rash    Has patient had a PCN reaction causing immediate rash, facial/tongue/throat swelling, SOB or lightheadedness with hypotension: unknown, childhood reaction    ROS As per HPI  PE: Vitals with BMI 04/10/2021 04/02/2021 11/14/2020  Height 5\' 7"  5\' 7"  5\' 7"   Weight 230 lbs 227 lbs 10 oz 233 lbs  BMI 36.01 35.64 36.48   Systolic 122 115 -  Diastolic 72 81 -  Pulse 99 92 -   General: Alert and well-appearing. Affect is pleasant, lucid thought and speech. Lower extremities show no pitting edema, no erythema, no joint swelling.  Left MTP without tenderness, warmth, erythema, or limitation in range of motion.  LABS:   Lab Results  Component Value Date   LABURIC 5.3 04/02/2021     Chemistry      Component Value Date/Time   NA 136 04/02/2021 1234   K  3.8 04/02/2021 1234   CL 101 04/02/2021 1234   CO2 25 04/02/2021 1234   BUN 8 04/02/2021 1234   CREATININE 0.85 04/02/2021 1234      Component Value Date/Time   CALCIUM 9.6 04/02/2021 1234   ALKPHOS 56 04/02/2021 1234   AST 17 04/02/2021 1234   ALT 21 04/02/2021 1234   BILITOT 0.6 04/02/2021 1234     Lab Results  Component Value Date   HGBA1C 7.4 (H) 04/02/2021   Lab Results  Component Value Date   CHOL 198 04/02/2021   HDL 44.80 04/02/2021   LDLDIRECT 115.0 04/02/2021   TRIG 302.0 (H) 04/02/2021   CHOLHDL 4 04/02/2021   Lab Results  Component Value Date   ESRSEDRATE 20 04/02/2021   Lab Results  Component Value Date   WBC 15.2 (H) 09/28/2020   HGB 15.6 (H) 09/28/2020   HCT 46.2 (H) 09/28/2020   MCV 84.6 09/28/2020   PLT 293.0 09/28/2020    IMPRESSION AND PLAN:  #1 left great toe pain, resolved.  Unclear if gout or not.  Unclear if her improvement can be attributed to the prednisone.  Of note her sed rate and uric acid a week ago were normal.  Observe. She has no lower extremity edema.  I reassured her.  2.  Diabetes, not ideal control.  History of intolerance to metformin twice daily.  She has restarted 500 mg twice daily 3 weeks ago and so far so good.  If she has trouble with this in the future she will call and we will switch to different medication. Repeat A1c 3 months.  3.  Hypertriglyceridemia.  Relatively noncompliant with fenofibrate.  She is back on this for the last few weeks and will continue.  Plan recheck lipid  panel and hepatic panel 3 months.  An After Visit Summary was printed and given to the patient.  FOLLOW UP: Return in about 3 months (around 07/09/2021) for routine chronic illness f/u (FASTING).  Signed:  Santiago Bumpers, MD           04/10/2021

## 2021-07-12 ENCOUNTER — Other Ambulatory Visit: Payer: Self-pay | Admitting: Family Medicine

## 2021-12-09 ENCOUNTER — Encounter: Payer: Self-pay | Admitting: Family Medicine

## 2021-12-09 ENCOUNTER — Ambulatory Visit (INDEPENDENT_AMBULATORY_CARE_PROVIDER_SITE_OTHER): Payer: Managed Care, Other (non HMO) | Admitting: Family Medicine

## 2021-12-09 VITALS — BP 129/83 | HR 94 | Temp 98.6°F | Ht 67.0 in | Wt 228.8 lb

## 2021-12-09 DIAGNOSIS — H60311 Diffuse otitis externa, right ear: Secondary | ICD-10-CM | POA: Diagnosis not present

## 2021-12-09 MED ORDER — CLINDAMYCIN HCL 300 MG PO CAPS: 300.0000 mg | ORAL_CAPSULE | Freq: Three times a day (TID) | ORAL | 0 refills | Status: DC

## 2021-12-09 MED ORDER — OFLOXACIN 0.3 % OT SOLN: OTIC | 0 refills | Status: DC

## 2021-12-09 NOTE — Progress Notes (Signed)
OFFICE VISIT  12/09/2021  CC: ear pain and f/u meds  Patient is a 41 y.o. female who presents for ear concerns.  HPI: Noted this morning that she had some crusty drainage from the right ear and could not hear very well. No significant pain. No fevers, no URI symptoms, no malaise. No recent swimming.  Past Medical History:  Diagnosis Date   Anxiety    Cervical spondylosis    Compression fracture of thoracic vertebra (HCC)    Mild; T12   Degenerative disc disease    Cervical, thoracic, lumbar   Depression    Situational   DM (diabetes mellitus), type 2 (HCC) 05/2017   new dx 05/14/17---nonfasting gluc >200, HbA1c 7.6%.   Eclampsia    Seizure 1 wk after delivery of 3rd child.   GERD (gastroesophageal reflux disease)    Helicobacter pylori gastritis 05/2017   Dx'd by clinical presentation + H pylori IgG positive.   Hemorrhoids    History of chronic sinusitis 2012   Hx of parotitis 10/2016   Left sided: dx'd at ED in [facility], rx'd keflex and flagyl and recovered uneventfully.   Hypertriglyceridemia 08/2018   Lumbar spondylosis    Migraine    Quiescent since her mid 27s.  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 Lovell Sheehan.   Vitamin D deficiency     Past Surgical History:  Procedure Laterality Date   CARDIOVASCULAR STRESS TEST  11/14/2020   NORMAL/LOW RISK   CHOLECYSTECTOMY N/A 07/05/2015   Procedure: LAPAROSCOPIC CHOLECYSTECTOMY WITH INTRAOPERATIVE CHOLANGIOGRAM;  Surgeon: Manus Rudd, MD;  Location: [facility] OR;  Service: General;  Laterality: N/A;   dexa  05/2017   Normal   DILATION AND EVACUATION  1999   WISDOM TOOTH EXTRACTION      Outpatient Medications Prior to Visit  Medication Sig Dispense Refill   cholecalciferol (VITAMIN D) 1000 units tablet Take 3,000 Units by mouth daily.     fenofibrate (TRICOR) 145  MG tablet Take 1 tablet (145 mg total) by mouth daily. 90 tablet 1   Fexofenadine HCl (ALLEGRA PO) Take by mouth as needed.     fluticasone (FLONASE) 50 MCG/ACT nasal spray Place into both nostrils daily.     levonorgestrel (MIRENA) 20 MCG/24HR IUD 1 each by Intrauterine route once.     LORazepam (ATIVAN) 0.5 MG tablet 1-2 tabs po tid prn anxiety 30 tablet 1   meloxicam (MOBIC) 15 MG tablet TAKE 1 TABLET BY MOUTH ONCE DAILY AS NEEDED WITH FOOD 30 tablet 0   metaxalone (SKELAXIN) 800 MG tablet Take 1 tablet (800 mg total) by mouth 4 (four) times daily as needed. 120 tablet 2   metFORMIN (GLUCOPHAGE) 500 MG tablet TAKE 1 TABLET BY MOUTH TWICE DAILY WITH A MEAL 180 tablet 0   mupirocin ointment (BACTROBAN) 2 % Apply 1 application topically 3 (three) times daily. 15 g 0   omeprazole (PRILOSEC OTC) 20 MG tablet Take 40 mg by mouth daily.     ondansetron (ZOFRAN) 8 MG tablet Take 1 tablet (8 mg total) by mouth every 8 (eight) hours as needed for nausea or vomiting. 30 tablet 0   rizatriptan (MAXALT) 10 MG tablet Take 1 tablet (10 mg total) by mouth as needed for migraine. May repeat in 2 hours if needed 10 tablet 6   No facility-administered medications prior  to visit.    Allergies  Allergen Reactions   Prednisone Other (See Comments)    Insomnia, cognitive clouding, "like I'm going crazy"   Tramadol Other (See Comments)    Per pt - unable describe feeling, just not well   Penicillins Rash    Has patient had a PCN reaction causing immediate rash, facial/tongue/throat swelling, SOB or lightheadedness with hypotension: unknown, childhood reaction    ROS As per HPI  PE:    12/09/2021    3:48 PM 04/10/2021    3:06 PM 04/02/2021   11:34 AM  Vitals with BMI  Height 5\' 7"  5\' 7"  5\' 7"   Weight 228 lbs 13 oz 230 lbs 227 lbs 10 oz  BMI 35.83 36.01 35.64  Systolic 129 122  Diastolic 83 72 81  Pulse 94 99 92    Physical Exam  Gen: Alert, well appearing.  Patient is oriented to person,  place, time, and situation. AFFECT: pleasant, lucid thought and speech. Right ear with a subtle deep hue of pink, no soft tissue swelling. Some crusty drainage in the ear opening.  Edematous canals, unable to view TM. Moisture noted in EAC There are few small superficial crusting lesions near the angle of the right mandible. Parotid glands normal. No neck adenopathy.  LABS:  Last CBC Lab Results  Component Value Date   WBC 15.2 (H) 09/28/2020   HGB 15.6 (H) 09/28/2020   HCT 46.2 (H) 09/28/2020   MCV 84.6 09/28/2020   MCH 28.2 07/03/2015   RDW 13.7 09/28/2020   PLT 293.0 09/28/2020   Last metabolic panel Lab Results  Component Value Date   GLUCOSE 104 (H) 04/02/2021   NA 136 04/02/2021   K 3.8 04/02/2021   CL 101 04/02/2021   CO2 25 04/02/2021   BUN 8 04/02/2021   CREATININE 0.85 04/02/2021   CALCIUM 9.6 04/02/2021   PROT 7.3 04/02/2021   ALBUMIN 4.3 04/02/2021   BILITOT 0.6 04/02/2021   ALKPHOS 56 04/02/2021   AST 17 04/02/2021   ALT 21 04/02/2021   Last lipids Lab Results  Component Value Date   CHOL 198 04/02/2021   HDL 44.80 04/02/2021   LDLDIRECT 115.0 04/02/2021   TRIG 302.0 (H) 04/02/2021   CHOLHDL 4 04/02/2021   Last hemoglobin A1c Lab Results  Component Value Date   HGBA1C 7.4 (H) 04/02/2021   Last thyroid functions Lab Results  Component Value Date   TSH 2.21 09/28/2020   IMPRESSION AND PLAN:  #1 acute otitis externa, right ear. She has penicillin allergy. I placed an ear wick today and she will need to do this daily with Floxin otic drops, 10 drops once a day for 5 days. Clindamycin 300 mg 3 times daily x 7 days.  Of note, she needs to make follow-up appointment for her diabetes and hypertriglyceridemia.  An After Visit Summary was printed and given to the patient.  FOLLOW UP: Return if symptoms worsen or fail to improve.  Signed:  04/04/2021, MD           12/09/2021

## 2022-01-17 ENCOUNTER — Encounter: Payer: Self-pay | Admitting: Family Medicine

## 2022-01-17 ENCOUNTER — Ambulatory Visit: Payer: Managed Care, Other (non HMO) | Admitting: Family Medicine

## 2022-01-17 ENCOUNTER — Other Ambulatory Visit: Payer: Self-pay | Admitting: Family Medicine

## 2022-01-17 VITALS — BP 137/89 | HR 93 | Temp 98.6°F | Wt 231.2 lb

## 2022-01-17 DIAGNOSIS — E781 Pure hyperglyceridemia: Secondary | ICD-10-CM | POA: Diagnosis not present

## 2022-01-17 DIAGNOSIS — E119 Type 2 diabetes mellitus without complications: Secondary | ICD-10-CM

## 2022-01-17 DIAGNOSIS — Z1231 Encounter for screening mammogram for malignant neoplasm of breast: Secondary | ICD-10-CM | POA: Diagnosis not present

## 2022-01-17 DIAGNOSIS — E559 Vitamin D deficiency, unspecified: Secondary | ICD-10-CM | POA: Diagnosis not present

## 2022-01-17 LAB — POCT GLYCOSYLATED HEMOGLOBIN (HGB A1C)
HbA1c POC (<> result, manual entry): 8.2 % (ref 4.0–5.6)
HbA1c, POC (controlled diabetic range): 8.2 % — AB (ref 0.0–7.0)
HbA1c, POC (prediabetic range): 8.2 % — AB (ref 5.7–6.4)
Hemoglobin A1C: 8.2 % — AB (ref 4.0–5.6)

## 2022-01-17 MED ORDER — RIZATRIPTAN BENZOATE 10 MG PO TABS: 10.0000 mg | ORAL_TABLET | ORAL | 6 refills | Status: DC | PRN

## 2022-01-17 MED ORDER — METFORMIN HCL 500 MG PO TABS: 500.0000 mg | ORAL_TABLET | Freq: Two times a day (BID) | ORAL | 1 refills | Status: DC

## 2022-01-17 MED ORDER — BLOOD GLUCOSE MONITOR KIT: PACK | 0 refills | Status: AC

## 2022-01-17 MED ORDER — FENOFIBRATE 145 MG PO TABS: 145.0000 mg | ORAL_TABLET | Freq: Every day | ORAL | 1 refills | Status: DC

## 2022-01-17 MED ORDER — METAXALONE 800 MG PO TABS: 800.0000 mg | ORAL_TABLET | Freq: Four times a day (QID) | ORAL | 2 refills | Status: DC | PRN

## 2022-01-17 NOTE — Progress Notes (Signed)
OFFICE VISIT  01/17/2022  CC:  Chief Complaint  Patient presents with   Diabetes    Pt not fasting   HPI:    Patient is a 41 y.o. female who presents for f/u DM and hypertriglyceridemia. I last saw her 9 months ago. A/P as of that visit: "#1 left great toe pain, resolved.  Unclear if gout or not.  Unclear if her improvement can be attributed to the prednisone.  Of note her sed rate and uric acid a week ago were normal.  Observe. She has no lower extremity edema.  I reassured her.  2.  Diabetes, not ideal control.  History of intolerance to metformin twice daily.  She has restarted 500 mg twice daily 3 weeks ago and so far so good.  If she has trouble with this in the future she will call and we will switch to different medication. Repeat A1c 3 months.  3.  Hypertriglyceridemia.  Relatively noncompliant with fenofibrate.  She is back on this for the last few weeks and will continue.  Plan recheck lipid panel and hepatic panel 3 months."  INTERIM HX: Jenna Taylor is feeling well.  No home glucose monitoring.  No burning, tingling, or numbness in feet. Taking metformin 500 mg once a day. Says her diet is fair but she is not exercising.  This has been limited due to recurrent low back pain when she exercises much.  Additionally, she has been too busy to exercise.  However she says now she will be having more time to exercise so she plans on restarting this.  She ran out of her own fenofibrate so she has been taking her sons, which she says is a smaller dose but she cannot recall what exact strength it is.  She has history of atypical chest pain as well as palpitations and is currently wearing a rhythm monitoring patch under the monitoring of cardiology.  She just saw them 2 days ago, I reviewed their note today.    He has a history of chronic neck and back pain.  She takes meloxicam and Skelaxin on a as needed basis.  ROS as above, plus--> no fevers, no CP, no SOB, no wheezing, no cough, no  dizziness, no HAs, no rashes, no melena/hematochezia.  No polyuria or polydipsia.  No myalgias. no focal weakness, paresthesias, or tremors.  No acute vision or hearing abnormalities.  No dysuria or unusual/new urinary urgency or frequency.  No recent changes in lower legs. No n/v/d or abd pain.    Past Medical History:  Diagnosis Date   Anxiety    Cervical spondylosis    Compression fracture of thoracic vertebra (HCC)    Mild; T12   Degenerative disc disease    Cervical, thoracic, lumbar   Depression    Situational   DM (diabetes mellitus), type 2 (HCC) 05/2017   new dx 05/14/17---nonfasting gluc >200, HbA1c 7.6%.   Eclampsia    Seizure 1 wk after delivery of 3rd child.   GERD (gastroesophageal reflux disease)    Helicobacter pylori gastritis 05/2017   Dx'd by clinical presentation + H pylori IgG positive.   Hemorrhoids    History of chronic sinusitis 2012   Hx of parotitis 10/2016   Left sided: dx'd at ED in [facility], rx'd keflex and flagyl and recovered uneventfully.   Hypertriglyceridemia 08/2018   Lumbar spondylosis    Migraine    Quiescent since her mid 7s.  Resurgence 2018   Ruptured cervical disc    Syrinx  of spinal cord (HCC)    Small thoracic syrinx--"stable over multiple years with multiple MRIs", "no need to worry about any further" per NS, Dr. Lovell Sheehan.   Vitamin D deficiency     Past Surgical History:  Procedure Laterality Date   CARDIOVASCULAR STRESS TEST  11/14/2020   NORMAL/LOW RISK   CHOLECYSTECTOMY N/A 07/05/2015   Procedure: LAPAROSCOPIC CHOLECYSTECTOMY WITH INTRAOPERATIVE CHOLANGIOGRAM;  Surgeon: Manus Rudd, MD;  Location: [facility] OR;  Service: General;  Laterality: N/A;   dexa  05/2017   Normal   DILATION AND EVACUATION  1999   WISDOM TOOTH EXTRACTION      Outpatient Medications Prior to Visit  Medication Sig Dispense Refill   cholecalciferol (VITAMIN D) 1000 units tablet Take 3,000 Units by mouth daily.     clindamycin (CLEOCIN) 300 MG capsule  Take 1 capsule (300 mg total) by mouth 3 (three) times daily. 21 capsule 0   fenofibrate (TRICOR) 145 MG tablet Take 1 tablet (145 mg total) by mouth daily. 90 tablet 1   Fexofenadine HCl (ALLEGRA PO) Take by mouth as needed.     fluticasone (FLONASE) 50 MCG/ACT nasal spray Place into both nostrils daily.     levonorgestrel (MIRENA) 20 MCG/24HR IUD 1 each by Intrauterine route once.     LORazepam (ATIVAN) 0.5 MG tablet 1-2 tabs po tid prn anxiety 30 tablet 1   meloxicam (MOBIC) 15 MG tablet TAKE 1 TABLET BY MOUTH ONCE DAILY AS NEEDED WITH FOOD 30 tablet 0   metaxalone (SKELAXIN) 800 MG tablet Take 1 tablet (800 mg total) by mouth 4 (four) times daily as needed. 120 tablet 2   metFORMIN (GLUCOPHAGE) 500 MG tablet TAKE 1 TABLET BY MOUTH TWICE DAILY WITH A MEAL 180 tablet 0   mupirocin ointment (BACTROBAN) 2 % Apply 1 application topically 3 (three) times daily. 15 g 0   ofloxacin (FLOXIN OTIC) 0.3 % OTIC solution 10 drops in R ear once daily x 5d 5 mL 0   omeprazole (PRILOSEC OTC) 20 MG tablet Take 40 mg by mouth daily.     ondansetron (ZOFRAN) 8 MG tablet Take 1 tablet (8 mg total) by mouth every 8 (eight) hours as needed for nausea or vomiting. 30 tablet 0   rizatriptan (MAXALT) 10 MG tablet Take 1 tablet (10 mg total) by mouth as needed for migraine. May repeat in 2 hours if needed 10 tablet 6   No facility-administered medications prior to visit.    Allergies  Allergen Reactions   Prednisone Other (See Comments)    Insomnia, cognitive clouding, "like I'm going crazy"   Tramadol Other (See Comments)    Per pt - unable describe feeling, just not well   Penicillins Rash    Has patient had a PCN reaction causing immediate rash, facial/tongue/throat swelling, SOB or lightheadedness with hypotension: unknown, childhood reaction    ROS As per HPI  PE:    01/17/2022    3:08 PM 12/09/2021    3:48 PM 04/10/2021    3:06 PM  Vitals with BMI  Height  5\' 7"  5\' 7"   Weight 231 lbs 3 oz 228 lbs  13 oz 230 lbs  BMI  35.83 36.01  Systolic 137 129  Diastolic 89 83 72  Pulse 93 94 99     Physical Exam  Gen: Alert, well appearing.  Patient is oriented to person, place, time, and situation. AFFECT: pleasant, lucid thought and speech. CV: RRR, no m/r/g.   LUNGS: CTA bilat, nonlabored resps, good aeration  in all lung fields. EXT: no clubbing or cyanosis.  no edema.  Foot exam - no swelling, tenderness or skin or vascular lesions. Color and temperature is normal. Sensation is intact. Peripheral pulses are palpable. Toenails are normal.   LABS:  Last CBC Lab Results  Component Value Date   WBC 15.2 (H) 09/28/2020   HGB 15.6 (H) 09/28/2020   HCT 46.2 (H) 09/28/2020   MCV 84.6 09/28/2020   MCH 28.2 07/03/2015   RDW 13.7 09/28/2020   PLT 293.0 09/28/2020   Last metabolic panel Lab Results  Component Value Date   GLUCOSE 104 (H) 04/02/2021   NA 136 04/02/2021   K 3.8 04/02/2021   CL 101 04/02/2021   CO2 25 04/02/2021   BUN 8 04/02/2021   CREATININE 0.85 04/02/2021   CALCIUM 9.6 04/02/2021   PROT 7.3 04/02/2021   ALBUMIN 4.3 04/02/2021   BILITOT 0.6 04/02/2021   ALKPHOS 56 04/02/2021   AST 17 04/02/2021   ALT 21 04/02/2021   Last lipids Lab Results  Component Value Date   CHOL 198 04/02/2021   HDL 44.80 04/02/2021   LDLDIRECT 115.0 04/02/2021   TRIG 302.0 (H) 04/02/2021   CHOLHDL 4 04/02/2021   Last hemoglobin A1c Lab Results  Component Value Date   HGBA1C 7.4 (H) 04/02/2021   Last thyroid functions Lab Results  Component Value Date   TSH 2.21 09/28/2020   Last vitamin D Lab Results  Component Value Date   VD25OH 23.81 (L) 09/28/2020   IMPRESSION AND PLAN:  #1 diabetes without complication, poor control. POC Hba1c today is 8.2%, increased from 7.4% in November 2022. She is pretty hesitant about increasing medication but was agreeable to increasing metformin to 500 mg twice a day. We did discuss getting her on a GLP-1 agonist and I gave her  the list of medication names to ask her insurer about coverage. New glucometer prescribed today and asked her to check glucose every morning and we will review these in 1 month. She will try to increase exercise now that she has more time. Feet exam normal today. When she returns for fasting labs she will give urine for microalbumin/creatinine.  #2 hypertriglyceridemia.  Unclear exactly how much she has been compliant with her fenofibrate. At any rate, she has been taking a lower dose of this medication recently, that she ran out last night. Refilled fenofibrate 145 mg today. She will return in a few days for fasting lipids and hepatic panel.  #3 vitamin D deficiency. Check vitamin D level with upcoming fasting lab work.  #4 chronic neck and back pain. Continue meloxicam and Robaxin on a as needed basis. Increase exercise.  An After Visit Summary was printed and given to the patient.  FOLLOW UP: 1 mo  Signed:  Santiago Bumpers, MD           01/17/2022

## 2022-01-17 NOTE — Telephone Encounter (Signed)
NDC not covered by insurance.cyclobenzaprine/methocarbamol are covered. please advise.

## 2022-01-17 NOTE — Patient Instructions (Signed)
Check your sugar every morning before eating and write numbers down to review with me in 1 month.  Call your insurer and check on coverage for the following medications: Ozempic, saxenda, trulicity, and mounjaro.

## 2022-01-20 ENCOUNTER — Encounter: Payer: Self-pay | Admitting: Family Medicine

## 2022-01-20 MED ORDER — CYCLOBENZAPRINE HCL 10 MG PO TABS: 10.0000 mg | ORAL_TABLET | Freq: Three times a day (TID) | ORAL | 3 refills | Status: DC | PRN

## 2022-01-20 NOTE — Telephone Encounter (Signed)
Cyclobenzaprine eRx'd 

## 2022-01-21 ENCOUNTER — Other Ambulatory Visit: Payer: Self-pay

## 2022-01-22 MED ORDER — METAXALONE 800 MG PO TABS: 800.0000 mg | ORAL_TABLET | Freq: Four times a day (QID) | ORAL | 5 refills | Status: DC | PRN

## 2022-01-22 NOTE — Telephone Encounter (Signed)
Britt--FYI, to keep you in the loop.  I called [facility] and hold for that did not decline to prescribe her Skelaxin.  I prescribed it 01/17/22 and it was sent back to me by the pharmacy stating it was not covered and they needed an alternative medication prescribed. Virgel Bouquet states that the pharmacy told her that I declined to fill Skelaxin. I am sending in another prescription for Skelaxin. Ultimately, patient may have to contact her insurer to further clarify this.  Okay to close this encounter. -thx

## 2022-01-23 NOTE — Telephone Encounter (Signed)
Noted  

## 2022-07-28 ENCOUNTER — Other Ambulatory Visit: Payer: Self-pay | Admitting: Family Medicine

## 2022-07-28 NOTE — Telephone Encounter (Signed)
Sent mychart message advising pt that an appt is needed 

## 2022-08-08 ENCOUNTER — Ambulatory Visit: Payer: Managed Care, Other (non HMO) | Admitting: Family Medicine

## 2022-08-08 ENCOUNTER — Encounter: Payer: Self-pay | Admitting: Family Medicine

## 2022-08-08 VITALS — BP 130/87 | HR 92 | Wt 218.6 lb

## 2022-08-08 DIAGNOSIS — E559 Vitamin D deficiency, unspecified: Secondary | ICD-10-CM

## 2022-08-08 DIAGNOSIS — M5412 Radiculopathy, cervical region: Secondary | ICD-10-CM

## 2022-08-08 DIAGNOSIS — E781 Pure hyperglyceridemia: Secondary | ICD-10-CM | POA: Diagnosis not present

## 2022-08-08 DIAGNOSIS — M542 Cervicalgia: Secondary | ICD-10-CM

## 2022-08-08 DIAGNOSIS — M545 Low back pain, unspecified: Secondary | ICD-10-CM

## 2022-08-08 DIAGNOSIS — G8929 Other chronic pain: Secondary | ICD-10-CM

## 2022-08-08 DIAGNOSIS — E119 Type 2 diabetes mellitus without complications: Secondary | ICD-10-CM | POA: Diagnosis not present

## 2022-08-08 LAB — POCT GLYCOSYLATED HEMOGLOBIN (HGB A1C)
HbA1c POC (<> result, manual entry): 7 % (ref 4.0–5.6)
HbA1c, POC (controlled diabetic range): 7 % (ref 0.0–7.0)
HbA1c, POC (prediabetic range): 7 % — AB (ref 5.7–6.4)
Hemoglobin A1C: 7 % — AB (ref 4.0–5.6)

## 2022-08-08 MED ORDER — FENOFIBRATE 145 MG PO TABS: 145.0000 mg | ORAL_TABLET | Freq: Every day | ORAL | 1 refills | Status: DC

## 2022-08-08 MED ORDER — METAXALONE 800 MG PO TABS: 800.0000 mg | ORAL_TABLET | Freq: Four times a day (QID) | ORAL | 5 refills | Status: DC | PRN

## 2022-08-08 NOTE — Addendum Note (Signed)
Addended by: Emi Holes D on: 08/08/2022 02:44 PM   Modules accepted: Orders

## 2022-08-08 NOTE — Progress Notes (Signed)
OFFICE VISIT  08/08/2022  CC:  Chief Complaint  Patient presents with   Acute Visit    She has been having some numbness and tingling on some of her fingers on her left hand for a few months now.    Patient is a 42 y.o. female who presents for 51-month follow-up diabetes and hypertriglyceridemia. Also wants to discuss ongoing left second and third finger numbness. A/P as of last visit: "1 diabetes without complication, poor control. POC Hba1c today is 8.2%, increased from 7.4% in November 2022. She is pretty hesitant about increasing medication but was agreeable to increasing metformin to 500 mg twice a day. We did discuss getting her on a GLP-1 agonist and I gave her the list of medication names to ask her insurer about coverage. New glucometer prescribed today and asked her to check glucose every morning and we will review these in 1 month. She will try to increase exercise now that she has more time. Feet exam normal today. When she returns for fasting labs she will give urine for microalbumin/creatinine.   #2 hypertriglyceridemia.  Unclear exactly how much she has been compliant with her fenofibrate. At any rate, she has been taking a lower dose of this medication recently, that she ran out last night. Refilled fenofibrate 145 mg today. She will return in a few days for fasting lipids and hepatic panel.   #3 vitamin D deficiency. Check vitamin D level with upcoming fasting lab work.   #4 chronic neck and back pain. Continue meloxicam and Robaxin on a as needed basis. Increase exercise."  INTERIM HX: She never returned for labs after last visit. She made some dietary changes but has not started exercising yet.  She has only been able to tolerate the metformin 500 mg once a day because twice a day gives her way too much GI upset.  She has remained on her fenofibrate daily.  She has chronic neck and low back pain.  She used to get some benefit out of meloxicam but insurer  declined this medication.  She switched over to  [facility] and that does help some.  For approximately the last 2 to 3 months she has been noticing persistent tingling/numbness sensation in the palmar aspect of the index and middle finger of the left hand.  No significant trigger for worsening, nothing seems to alleviate it.  Denies hand weakness.  Occasionally her neck pain will sharply radiate down the left arm and she will have some full arm paresthesias.  This is only brief and then it goes back to only the 2 fingers feeling numb.  No color change in the fingers.  Right hand without any symptoms.      Past Medical History:  Diagnosis Date   Anxiety    Cervical spondylosis    Compression fracture of thoracic vertebra    Mild; T12   Degenerative disc disease    Cervical, thoracic, lumbar   Depression    Situational   DM (diabetes mellitus), type 2 05/2017   new dx 05/14/17---nonfasting gluc >200, HbA1c 7.6%.   Eclampsia    Seizure 1 wk after delivery of 3rd child.   GERD (gastroesophageal reflux disease)    Helicobacter pylori gastritis 05/2017   Dx'd by clinical presentation + H pylori IgG positive.   Hemorrhoids    History of chronic sinusitis 2012   Hx of parotitis 10/2016   Left sided: dx'd at ED in [facility], rx'd keflex and flagyl and recovered uneventfully.  Hypertriglyceridemia 08/2018   Lumbar spondylosis    Migraine    Quiescent since her mid 5220s.  Resurgence 2018   Ruptured cervical disc    Syrinx of spinal cord    Small thoracic syrinx--"stable over multiple years with multiple MRIs", "no need to worry about any further" per NS, Dr. Lovell SheehanJenkins.   Vitamin D deficiency     Past Surgical History:  Procedure Laterality Date   CARDIOVASCULAR STRESS TEST  11/14/2020   NORMAL/LOW RISK   CHOLECYSTECTOMY N/A 07/05/2015   Procedure: LAPAROSCOPIC CHOLECYSTECTOMY WITH INTRAOPERATIVE CHOLANGIOGRAM;  Surgeon: Manus RuddMatthew Tsuei, MD;  Location: [facility] OR;  Service: General;   Laterality: N/A;   dexa  05/2017   Normal   DILATION AND EVACUATION  1999   WISDOM TOOTH EXTRACTION      Outpatient Medications Prior to Visit  Medication Sig Dispense Refill   blood glucose meter kit and supplies KIT Dispense based on patient and insurance preference. Use up to four times daily as directed. 1 each 0   cholecalciferol (VITAMIN D) 1000 units tablet Take 3,000 Units by mouth daily.     Fexofenadine HCl (ALLEGRA PO) Take by mouth as needed.     fluticasone (FLONASE) 50 MCG/ACT nasal spray Place into both nostrils daily.     Lancets (ONETOUCH DELICA PLUS LANCET33G) MISC SMARTSIG:1 Topical 4 Times Daily     levonorgestrel (MIRENA) 20 MCG/24HR IUD 1 each by Intrauterine route once.     metFORMIN (GLUCOPHAGE) 500 MG tablet Take 1 tablet (500 mg total) by mouth 2 (two) times daily with a meal. 180 tablet 1   mupirocin ointment (BACTROBAN) 2 % Apply 1 application topically 3 (three) times daily. 15 g 0   ofloxacin (FLOXIN OTIC) 0.3 % OTIC solution 10 drops in R ear once daily x 5d 5 mL 0   omeprazole (PRILOSEC OTC) 20 MG tablet Take 40 mg by mouth daily.     ONETOUCH VERIO test strip 1 each 4 (four) times daily.     rizatriptan (MAXALT) 10 MG tablet Take 1 tablet (10 mg total) by mouth as needed for migraine. May repeat in 2 hours if needed 10 tablet 6   fenofibrate (TRICOR) 145 MG tablet Take 1 tablet (145 mg total) by mouth daily. 90 tablet 1   meloxicam (MOBIC) 15 MG tablet TAKE 1 TABLET BY MOUTH ONCE DAILY AS NEEDED WITH FOOD 30 tablet 0   metaxalone (SKELAXIN) 800 MG tablet Take 1 tablet (800 mg total) by mouth 4 (four) times daily as needed. 120 tablet 5   No facility-administered medications prior to visit.    Allergies  Allergen Reactions   Prednisone Other (See Comments)    Insomnia, cognitive clouding, "like I'm going crazy"   Tramadol Other (See Comments)    Per pt - unable describe feeling, just not well   Penicillins Rash    Has patient had a PCN reaction  causing immediate rash, facial/tongue/throat swelling, SOB or lightheadedness with hypotension: unknown, childhood reaction    Review of Systems As per HPI  PE:    08/08/2022    2:12 PM 01/17/2022    3:08 PM 12/09/2021    3:48 PM  Vitals with BMI  Height   5\' 7"   Weight 218 lbs 10 oz 231 lbs 3 oz 228 lbs 13 oz  BMI   35.83  Systolic 130 137 [id number]  Diastolic 87 89 83  Pulse 92 93 94     Physical Exam  Gen: Alert, well appearing.  Patient  is oriented to person, place, time, and situation. AFFECT: pleasant, lucid thought and speech. Range of motion of the neck is moderately impaired due to pain and stiffness.  MOTOR EXAM:  Shoulder aBduction (C5): 5/5 Elbow flexion (C5-6): 5/5 Elbow extension (C6-7): 5/5 Wrist extension (C6-7): 5/5 Wrist flexion (C7-8): 5/5 Finger flexion (C8): 5/5 Finger extension (C8): 5/5 Finger aBduction: (T1): 5/5  Mild decreased fine touch sensation in palmar aspect of index and middle finger on left hand.  Radial pulses intact bilaterally.  Tinel's negative bilaterally.  Phalen's negative bilaterally.   LABS:  Last CBC Lab Results  Component Value Date   WBC 15.2 (H) 09/28/2020   HGB 15.6 (H) 09/28/2020   HCT 46.2 (H) 09/28/2020   MCV 84.6 09/28/2020   MCH 28.2 07/03/2015   RDW 13.7 09/28/2020   PLT 293.0 09/28/2020   Last metabolic panel Lab Results  Component Value Date   GLUCOSE 104 (H) 04/02/2021   NA 136 04/02/2021   K 3.8 04/02/2021   CL 101 04/02/2021   CO2 25 04/02/2021   BUN 8 04/02/2021   CREATININE 0.85 04/02/2021   CALCIUM 9.6 04/02/2021   PROT 7.3 04/02/2021   ALBUMIN 4.3 04/02/2021   BILITOT 0.6 04/02/2021   ALKPHOS 56 04/02/2021   AST 17 04/02/2021   ALT 21 04/02/2021   Last lipids Lab Results  Component Value Date   CHOL 198 04/02/2021   HDL 44.80 04/02/2021   LDLDIRECT 115.0 04/02/2021   TRIG 302.0 (H) 04/02/2021   CHOLHDL 4 04/02/2021   Last hemoglobin A1c Lab Results  Component Value Date   HGBA1C  7.0 (A) 08/08/2022   HGBA1C 7.0 08/08/2022   HGBA1C 7.0 (A) 08/08/2022   HGBA1C 7.0 08/08/2022   Last thyroid functions Lab Results  Component Value Date   TSH 2.21 09/28/2020   Last vitamin D Lab Results  Component Value Date   VD25OH 23.81 (L) 09/28/2020   IMPRESSION AND PLAN:  #1 diabetes without complication.  Her control is improved--> hemoglobin A1c is down to 7% she will continue to work on diet and hopefully start exercising soon.  She will continue metformin 500 mg once daily. Electrolytes and creatinine today. Urine microalbumin/creatinine today.  2.  Hypertriglyceridemia.  Doing well on fenofibrate 145 mg daily. Lipid panel and hepatic panel today.  She ate a chicken salad about 2-1/2 to 3 hours ago.  #3 vitamin D deficiency. 3000 units/day supplement.  Recheck vitamin D level today.  #4 chronic neck and low back pain. She remains functional, puts up with quite a bit of pain. Skelaxin is mildly helpful.  Refilled 800 mg tabs, 1 4 times daily as needed. Referral to PT.  5.  Cervical radiculopathy. Her radiculopathy symptoms are brief and intermittent for the most part.  Her recent persistent numbness and digits 2 and 3 of the left hand may certainly be part of this.  Patient has seen neurosurgery multiple times in the past and surgery has been recommended but she has declined. PT referral.  #6 left hand numbness in second and third digits. C7 radiculopathy more likely than carpal tunnel syndrome in this case. (Of note, MSK u/s today of median nerve area showed L side 1.1 cm and R side 0.9 cm).  An After Visit Summary was printed and given to the patient.  FOLLOW UP: Return in about 3 months (around 11/07/2022) for routine chronic illness f/u.  Signed:  Santiago BumpersPhil Layson Bertsch, MD           08/08/2022

## 2022-08-09 LAB — CBC
HCT: 43.9 % (ref 35.0–45.0)
Hemoglobin: 14.3 g/dL (ref 11.7–15.5)
MCH: 27.8 pg (ref 27.0–33.0)
MCHC: 32.6 g/dL (ref 32.0–36.0)
MCV: 85.4 fL (ref 80.0–100.0)
MPV: 11.2 fL (ref 7.5–12.5)
Platelets: 318 10*3/uL (ref 140–400)
RBC: 5.14 10*6/uL — ABNORMAL HIGH (ref 3.80–5.10)
RDW: 12.3 % (ref 11.0–15.0)
WBC: 9.9 10*3/uL (ref 3.8–10.8)

## 2022-08-09 LAB — VITAMIN D 25 HYDROXY (VIT D DEFICIENCY, FRACTURES): Vit D, 25-Hydroxy: 43 ng/mL (ref 30–100)

## 2022-08-09 LAB — MICROALBUMIN / CREATININE URINE RATIO
Creatinine, Urine: 71 mg/dL (ref 20–275)
Microalb, Ur: <0.2 mg/dL

## 2022-08-09 LAB — LIPID PANEL
Cholesterol: 178 mg/dL (ref <200)
HDL: 43 mg/dL — ABNORMAL LOW (ref >=50)
LDL Cholesterol (Calc): 100 mg/dL (calc) — ABNORMAL HIGH
Non-HDL Cholesterol (Calc): 135 mg/dL (calc) — ABNORMAL HIGH (ref <130)
Total CHOL/HDL Ratio: 4.1 (calc) (ref <5.0)
Triglycerides: 232 mg/dL — ABNORMAL HIGH (ref <150)

## 2022-08-09 LAB — COMPREHENSIVE METABOLIC PANEL
AG Ratio: 1.4 (calc) (ref 1.0–2.5)
ALT: 22 U/L (ref 6–29)
AST: 15 U/L (ref 10–30)
Albumin: 4.2 g/dL (ref 3.6–5.1)
Alkaline phosphatase (APISO): 56 U/L (ref 31–125)
BUN: 9 mg/dL (ref 7–25)
CO2: 25 mmol/L (ref 20–32)
Calcium: 9.6 mg/dL (ref 8.6–10.2)
Chloride: 105 mmol/L (ref 98–110)
Creat: 0.93 mg/dL (ref 0.50–0.99)
Globulin: 2.9 g/dL (calc) (ref 1.9–3.7)
Glucose, Bld: 133 mg/dL — ABNORMAL HIGH (ref 65–99)
Potassium: 4.4 mmol/L (ref 3.5–5.3)
Sodium: 140 mmol/L (ref 135–146)
Total Bilirubin: 0.3 mg/dL (ref 0.2–1.2)
Total Protein: 7.1 g/dL (ref 6.1–8.1)

## 2022-08-09 LAB — TSH: TSH: 1.18 mIU/L

## 2022-09-05 ENCOUNTER — Telehealth: Payer: Self-pay | Admitting: Family Medicine

## 2022-09-05 DIAGNOSIS — M5412 Radiculopathy, cervical region: Secondary | ICD-10-CM

## 2022-09-05 NOTE — Telephone Encounter (Signed)
Patient called and is requesting to have a MRI referral. She is requesting this because Dr. Milinda Cave is aware of her concerns.  Please give the patient a call.

## 2022-09-05 NOTE — Telephone Encounter (Signed)
Pt advised.

## 2022-09-05 NOTE — Telephone Encounter (Signed)
Okay, MRI of the neck ordered for [facility] imaging on [address]

## 2022-09-05 NOTE — Telephone Encounter (Signed)
Last OV 4/5. Please advise if referral is ok

## 2022-09-27 ENCOUNTER — Ambulatory Visit
Admission: RE | Admit: 2022-09-27 | Discharge: 2022-09-27 | Discharge status: Home / Self Care | Payer: Managed Care, Other (non HMO) | Source: Ambulatory Visit | Attending: Family Medicine | Admitting: Family Medicine

## 2022-09-27 DIAGNOSIS — M5412 Radiculopathy, cervical region: Secondary | ICD-10-CM

## 2022-09-28 ENCOUNTER — Other Ambulatory Visit: Payer: Managed Care, Other (non HMO)

## 2022-09-30 ENCOUNTER — Encounter: Payer: Self-pay | Admitting: Family Medicine

## 2022-09-30 ENCOUNTER — Telehealth: Payer: Self-pay

## 2022-09-30 DIAGNOSIS — G9520 Unspecified cord compression: Secondary | ICD-10-CM

## 2022-09-30 DIAGNOSIS — M5412 Radiculopathy, cervical region: Secondary | ICD-10-CM

## 2022-09-30 NOTE — Telephone Encounter (Signed)
Vicente Serene from Radiology called to provide STAT report regarding impressions.   IMPRESSION: 1. Left paracentral disc osteophyte complex at C5-6 with resultant moderate to severe spinal and cord compression. Patchy signal abnormality involving the left greater than right cord at this level is nonspecific, but favored to reflect an evolving cord contusion. Possible demyelination or neoplasm is not excluded, although are felt to be less likely given the adjacent disc disease at this level. Neurosurgical referral recommended. Correlation with dedicated postcontrast imaging of the cervical spine could be considered for complete evaluation. Additionally, a short interval follow-up exam to ensure these changes resolve is also recommended.  2. Left paracentral disc protrusion at C4-5 with resultant moderate spinal stenosis, with mild to moderate bilateral C5 foraminal narrowing.  3. Right paracentral disc osteophyte complex at C3-4 with resultant moderate spinal stenosis, with mild right worse than left C4 foraminal narrowing.   Please further advise.

## 2023-02-27 ENCOUNTER — Ambulatory Visit: Payer: Managed Care, Other (non HMO)

## 2023-03-05 ENCOUNTER — Ambulatory Visit: Payer: Managed Care, Other (non HMO) | Attending: Neurosurgery

## 2023-03-05 ENCOUNTER — Other Ambulatory Visit: Payer: Self-pay

## 2023-03-05 DIAGNOSIS — M5412 Radiculopathy, cervical region: Secondary | ICD-10-CM | POA: Diagnosis present

## 2023-03-05 DIAGNOSIS — M6281 Muscle weakness (generalized): Secondary | ICD-10-CM | POA: Diagnosis present

## 2023-03-05 NOTE — Therapy (Signed)
OUTPATIENT PHYSICAL THERAPY CERVICAL EVALUATION   Patient Name: Jenna Taylor MRN: [id number] DOB:1981/02/11, 42 y.o., female Today's Date: 03/05/2023  END OF SESSION:  PT End of Session - 03/05/23 0856     Visit Number 1    Number of Visits 6    Date for PT Re-Evaluation 04/03/23    PT Start Time 0855    PT Stop Time 0941    PT Time Calculation (min) 46 min    Activity Tolerance Patient tolerated treatment well    Behavior During Therapy [facility] for tasks assessed/performed             Past Medical History:  Diagnosis Date   Anxiety    Cervical spinal cord compression (HCC)    Cervical spondylosis    Compression fracture of thoracic vertebra (HCC)    Mild; T12   Degenerative disc disease    Cervical, thoracic, lumbar   Depression    Situational   DM (diabetes mellitus), type 2 (HCC) 05/2017   new dx 05/14/17---nonfasting gluc >200, HbA1c 7.6%.   Eclampsia    Seizure 1 wk after delivery of 3rd child.   GERD (gastroesophageal reflux disease)    Helicobacter pylori gastritis 05/2017   Dx'd by clinical presentation + H pylori IgG positive.   Hemorrhoids    History of chronic sinusitis 2012   Hx of parotitis 10/2016   Left sided: dx'd at ED in [facility], rx'd keflex and flagyl and recovered uneventfully.   Hypertriglyceridemia 08/2018   Lumbar spondylosis    Migraine    Quiescent since her mid 82s.  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 Lovell Sheehan.   Vitamin D deficiency    Past Surgical History:  Procedure Laterality Date   CARDIOVASCULAR STRESS TEST  11/14/2020   NORMAL/LOW RISK   CHOLECYSTECTOMY N/A 07/05/2015   Procedure: LAPAROSCOPIC CHOLECYSTECTOMY WITH INTRAOPERATIVE CHOLANGIOGRAM;  Surgeon: Manus Rudd, MD;  Location: [facility] OR;  Service:  General;  Laterality: N/A;   dexa  05/2017   Normal   DILATION AND EVACUATION  1999   WISDOM TOOTH EXTRACTION     Patient Active Problem List   Diagnosis Date Noted   Syrinx of spinal cord (HCC) 09/20/2020   Abdominal pain, left upper quadrant 07/30/2015   Cervical lymphadenitis 08/14/2014   GAD (generalized anxiety disorder) 08/14/2014   Sinusitis, acute 07/28/2013   Allergic rhinitis 07/28/2013   Acute bronchitis 07/28/2013   Spinal stenosis 02/21/2011   Thoracic degenerative disc disease 09/20/2004    PCP: Jeoffrey Massed, MD  REFERRING PROVIDER: Tressie Stalker, MD   REFERRING DIAG: Cervicalgia   THERAPY DIAG:  Radiculopathy, cervical region  Muscle weakness (generalized)  Rationale for Evaluation and Treatment: Rehabilitation  ONSET DATE: 10/27/22  SUBJECTIVE:  SUBJECTIVE STATEMENT: Patient reports that she has been having tingling in both hands and fingers since she had surgery on 10/27/22. She also has pain in her neck into both shoulders with the right side of her neck hurting worse than the left.  Hand dominance: Right  PERTINENT HISTORY:  Diabetes type 2, anxiety, degenerative disc disease, and depression  PAIN:  Are you having pain? Yes: NPRS scale: 5-6/10 Pain location: neck and both shoulders with tingling in both hands  Pain description: constant ache with tingling in the hands Aggravating factors: movement Relieving factors: rest  PRECAUTIONS: None  RED FLAGS: None     WEIGHT BEARING RESTRICTIONS: No  FALLS:  Has patient fallen in last 6 months? No  LIVING ENVIRONMENT: Lives with: lives with an adult companion Lives in: House/apartment Has following equipment at home: None  OCCUPATION: Merchandiser, retail for [employer]  PLOF:  Independent  PATIENT GOALS: reduced pain and tingling, be able to do crafting  NEXT MD VISIT: April 2025  OBJECTIVE:  Note: Objective measures were completed at Evaluation unless otherwise noted.  COGNITION: Overall cognitive status: Within functional limits for tasks assessed  SENSATION: Light touch: Impaired with increased sensitivity in RUE Patient reports tingling in both hands and fingers  POSTURE: forward head  PALPATION: TTP: bilateral scapular stabilizers and cervical musculature   CERVICAL ROM:   Active ROM A/PROM (deg) eval  Flexion 20  Extension 24  Right lateral flexion 20; right sided neck pain  Left lateral flexion 24  Right rotation 46; shooting pain down her right arm  Left rotation 40   (Blank rows = not tested)  UPPER EXTREMITY ROM: WFL for activities assessed  UPPER EXTREMITY MMT:  MMT Right eval Left eval  Shoulder flexion 4/5: "I feel it"  4/5: "I feel it"   Shoulder extension    Shoulder abduction 4/5: "I feel it"  4/5: "I feel it"   Shoulder adduction    Shoulder extension    Shoulder internal rotation    Shoulder external rotation    Middle trapezius    Lower trapezius    Elbow flexion 4/5: "the tingling is everywhere now"  4-/5: "the tingling is everywhere now"   Elbow extension 4/5 4/5  Wrist flexion    Wrist extension    Wrist ulnar deviation    Wrist radial deviation    Wrist pronation    Wrist supination    Grip strength 60 57   (Blank rows = not tested)  CERVICAL SPECIAL TESTS:  Spurling's test: Positive, Distraction test: Positive, and Sharp pursor's test: Negative  TODAY'S TREATMENT:                                                                                                                              DATE:  03/05/23 EXERCISE LOG  Exercise Repetitions and Resistance Comments  Cervical distraction    Cervical SNAG's    For rotation   Scapular retraction     Self STM with  tennis ball          Blank cell = exercise not performed today   PATIENT EDUCATION:  Education details: HEP, POC, healing, prognosis, healing, activity modification, and goals for therapy  Person educated: Patient Education method: Explanation Education comprehension: verbalized understanding  HOME EXERCISE PROGRAM: [id number]  ASSESSMENT:  CLINICAL IMPRESSION: Patient is a 42 y.o. female who was seen today for physical therapy evaluation and treatment for chronic cervical radiculopathy. She presented with moderate pain severity and irritability with cervical AROM and manual muscle testing reproducing her familiar symptoms. She was provided a HEP which she was able to properly demonstrate. She reported feeling comfortable with these interventions. Recommend that she continue with skilled physical therapy to address her remaining impairments to return to her prior level of function.    OBJECTIVE IMPAIRMENTS: decreased activity tolerance, decreased ROM, decreased strength, hypomobility, impaired flexibility, impaired sensation, impaired tone, impaired UE functional use, postural dysfunction, and pain.   ACTIVITY LIMITATIONS: carrying, lifting, reach over head, and hygiene/grooming  PARTICIPATION LIMITATIONS: meal prep, cleaning, laundry, driving, shopping, community activity, and occupation  PERSONAL FACTORS: Past/current experiences, Time since onset of injury/illness/exacerbation, and 3+ comorbidities: Diabetes type 2, anxiety, degenerative disc disease, and depression  are also affecting patient's functional outcome.   REHAB POTENTIAL: Good  CLINICAL DECISION MAKING: Evolving/moderate complexity  EVALUATION COMPLEXITY: Moderate   GOALS: Goals reviewed with patient? Yes  LONG TERM GOALS: Target date: 04/02/23  Patient will be independent with her HEP.  Baseline:  Goal status: INITIAL  2.  Patient will be able to complete her daily activities without her familiar pain exceeding  3/10. Baseline:  Goal status: INITIAL  3.  Patient will improve her bilateral cervical rotation to at least 60 degrees for improved awareness of her surroundings.  Baseline:  Goal status: INITIAL  4.  Patient will be able carry at least 8 pounds without being limited by her familiar symptoms for improved function carrying her groceries.  Baseline:  Goal status: INITIAL  PLAN:  PT FREQUENCY: 1-2x/week  PT DURATION: 4 weeks  PLANNED INTERVENTIONS: 97164- PT Re-evaluation, 97110-Therapeutic exercises, 97530- Therapeutic activity, 97112- Neuromuscular re-education, 97535- Self Care, 30865- Manual therapy, 97014- Electrical stimulation (unattended), 97035- Ultrasound, 78469- Traction (mechanical), Patient/Family education, Dry Needling, Spinal mobilization, Cryotherapy, and Moist heat  PLAN FOR NEXT SESSION: UBE, review and update her HEP, manual therapy, and modalities as needed   Granville Lewis, PT 03/05/2023, 5:47 PM

## 2023-04-24 ENCOUNTER — Telehealth: Payer: Self-pay

## 2023-04-28 ENCOUNTER — Other Ambulatory Visit: Payer: Self-pay | Admitting: Family Medicine

## 2023-04-29 NOTE — Progress Notes (Unsigned)
Office Note 04/30/2023  CC:  Chief Complaint  Patient presents with   Diabetes   Patient is a 42 y.o. female who is here for annual health maintenance exam and 6 mo f/u DM, hypertrig, and chronic neck and low back pain. A/P as of last visit: "#1 diabetes without complication.  Her control is improved--> hemoglobin A1c is down to 7% she will continue to work on diet and hopefully start exercising soon.  She will continue metformin 500 mg once daily. Electrolytes and creatinine today. Urine microalbumin/creatinine today.   2.  Hypertriglyceridemia.  Doing well on fenofibrate 145 mg daily. Lipid panel and hepatic panel today.  She ate a chicken salad about 2-1/2 to 3 hours ago.   #3 vitamin D deficiency. 3000 units/day supplement.  Recheck vitamin D level today.   #4 chronic neck and low back pain. She remains functional, puts up with quite a bit of pain. Skelaxin is mildly helpful.  Refilled 800 mg tabs, 1 4 times daily as needed. Referral to PT.   5.  Cervical radiculopathy. Her radiculopathy symptoms are brief and intermittent for the most part.  Her recent persistent numbness and digits 2 and 3 of the left hand may certainly be part of this.  Patient has seen neurosurgery multiple times in the past and surgery has been recommended but she has declined. PT referral.   #6 left hand numbness in second and third digits. C7 radiculopathy more likely than carpal tunnel syndrome in this case. (Of note, MSK u/s today of median nerve area showed L side 1.1 cm and R side 0.9 cm)."  INTERIM HX: She got cervical spine fusion surgery in June this year.  She got some PT after and has been continuing this at home and feels like she is doing much better. Numbness in all fingers of both hands continues though.  No weakness in arms or hands.  Glucoses have been harder to control lately, particularly since running out of metformin recently.  She only takes metformin 500 mg tab once a day  usually, though. Diet has been better lately since being out of metformin.  Taking cinnamon pills. Exercise is very limited due to her fear she is in her neck.  Says blood pressure was initially up in the recovery room after her surgery in June and has continued to be in the 140/90 range at home and at work.  She is not on any blood pressure medication.   Past Medical History:  Diagnosis Date   Anxiety    Cervical spinal cord compression (HCC)    Cervical spondylosis    Compression fracture of thoracic vertebra (HCC)    Mild; T12   Degenerative disc disease    Cervical, thoracic, lumbar   Depression    Situational   DM (diabetes mellitus), type 2 (HCC) 05/2017   new dx 05/14/17---nonfasting gluc >200, HbA1c 7.6%.   Eclampsia    Seizure 1 wk after delivery of 3rd child.   GERD (gastroesophageal reflux disease)    Helicobacter pylori gastritis 05/2017   Dx'd by clinical presentation + H pylori IgG positive.   Hemorrhoids    History of chronic sinusitis 2012   Hx of parotitis 10/2016   Left sided: dx'd at ED in [facility], rx'd keflex and flagyl and recovered uneventfully.   Hypertriglyceridemia 08/2018   Lumbar spondylosis    Migraine    Quiescent since her mid 49s.  Resurgence 2018   Ruptured cervical disc    Spinal stenosis of  cervical region with radiculopathy    Syrinx of spinal cord (HCC)    Small thoracic syrinx--"stable over multiple years with multiple MRIs", "no need to worry about any further" per NS, Dr. Lovell Sheehan.   Vitamin D deficiency     Past Surgical History:  Procedure Laterality Date   CARDIOVASCULAR STRESS TEST  11/14/2020   NORMAL/LOW RISK   CHOLECYSTECTOMY N/A 07/05/2015   Procedure: LAPAROSCOPIC CHOLECYSTECTOMY WITH INTRAOPERATIVE CHOLANGIOGRAM;  Surgeon: Manus Rudd, MD;  Location: [facility] OR;  Service: General;  Laterality: N/A;   dexa  05/2017   Normal   DILATION AND EVACUATION  1999   WISDOM TOOTH EXTRACTION      Family History  Problem  Relation Age of Onset   Depression Mother    COPD Father    Heart disease Father    Clotting disorder Brother    Osteoporosis Maternal Grandmother    Cancer Maternal Grandfather    Kidney disease Maternal Grandfather    Heart disease Maternal Grandfather    Arthritis Paternal Grandmother    Cancer Paternal Grandmother    Cancer Paternal Grandfather    Breast cancer Neg Hx     Social History   Socioeconomic History   Marital status: Single    Spouse name: Not on file   Number of children: Not on file   Years of education: Not on file   Highest education level: Not on file  Occupational History   Not on file  Tobacco Use   Smoking status: Former    Current packs/day: 0.00    Types: Cigarettes    Quit date: 05/15/2016    Years since quitting: 6.9   Smokeless tobacco: Never  Substance and Sexual Activity   Alcohol use: Yes    Comment: socially   Drug use: No   Sexual activity: Yes    Birth control/protection: I.U.D.  Other Topics Concern   Not on file  Social History Narrative   Single, 3 children (13 yr, 11 yr, 7 yr).   Occupation: CNA at [employer].   From [place].   Tobacco: 10 pack-yr hx--quitting with e-cigs as of 07/2013.   Alcohol: rare.     No drugs.   Social Drivers of Corporate investment banker Strain: Not on file  Food Insecurity: No Food Insecurity (01/15/2022)   Received from [facility]   Hunger Vital Sign    Worried About Running Out of Food in the Last Year: Never true    Ran Out of Food in the Last Year: Never true  Transportation Needs: Not on file  Physical Activity: Not on file  Stress: Not on file  Social Connections: Unknown (09/10/2021)   Received from [facility]   Social Network    Social Network: Not on file  Intimate Partner Violence: Unknown (08/07/2021)   Received from [facility]   HITS    Physically Hurt: Not on file    Insult or Talk Down To: Not on file    Threaten  Physical Harm: Not on file    Scream or Curse: Not on file    Outpatient Medications Prior to Visit  Medication Sig Dispense Refill   blood glucose meter kit and supplies KIT Dispense based on patient and insurance preference. Use up to four times daily as directed. 1 each 0   cholecalciferol (VITAMIN D) 1000 units tablet Take 3,000 Units by mouth daily.     fenofibrate (TRICOR) 145 MG tablet Take 1  tablet (145 mg total) by mouth daily. 90 tablet 1   Fexofenadine HCl (ALLEGRA PO) Take by mouth as needed.     fluticasone (FLONASE) 50 MCG/ACT nasal spray Place into both nostrils daily.     Lancets (ONETOUCH DELICA PLUS LANCET33G) MISC SMARTSIG:1 Topical 4 Times Daily     levonorgestrel (MIRENA) 20 MCG/24HR IUD 1 each by Intrauterine route once.     metaxalone (SKELAXIN) 800 MG tablet Take 1 tablet (800 mg total) by mouth 4 (four) times daily as needed. 120 tablet 5   metFORMIN (GLUCOPHAGE) 500 MG tablet Take 1 tablet (500 mg total) by mouth 2 (two) times daily with a meal. 180 tablet 1   mupirocin ointment (BACTROBAN) 2 % Apply 1 application topically 3 (three) times daily. 15 g 0   ofloxacin (FLOXIN OTIC) 0.3 % OTIC solution 10 drops in R ear once daily x 5d 5 mL 0   omeprazole (PRILOSEC OTC) 20 MG tablet Take 40 mg by mouth daily.     ONETOUCH VERIO test strip 1 each 4 (four) times daily.     rizatriptan (MAXALT) 10 MG tablet Take 1 tablet (10 mg total) by mouth as needed for migraine. May repeat in 2 hours if needed 10 tablet 6   No facility-administered medications prior to visit.    Allergies  Allergen Reactions   Prednisone Other (See Comments)    Insomnia, cognitive clouding, "like I'm going crazy"   Tramadol Other (See Comments)    Per pt - unable describe feeling, just not well   Penicillins Rash    Has patient had a PCN reaction causing immediate rash, facial/tongue/throat swelling, SOB or lightheadedness with hypotension: unknown, childhood reaction    Review of Systems   Constitutional:  Negative for appetite change, chills, fatigue and fever.  HENT:  Negative for congestion, dental problem, ear pain and sore throat.   Eyes:  Negative for discharge, redness and visual disturbance.  Respiratory:  Negative for cough, chest tightness, shortness of breath and wheezing.   Cardiovascular:  Negative for chest pain, palpitations and leg swelling.  Gastrointestinal:  Negative for abdominal pain, blood in stool, diarrhea, nausea and vomiting.  Genitourinary:  Negative for difficulty urinating, dysuria, flank pain, frequency, hematuria and urgency.  Musculoskeletal:  Negative for arthralgias, back pain, joint swelling, myalgias and neck stiffness.  Skin:  Negative for pallor and rash.  Neurological:  Positive for numbness (both hands). Negative for dizziness, speech difficulty, weakness and headaches.  Hematological:  Negative for adenopathy. Does not bruise/bleed easily.  Psychiatric/Behavioral:  Negative for confusion and sleep disturbance. The patient is not nervous/anxious.     PE;    04/30/2023    9:06 AM 04/30/2023    9:05 AM 08/08/2022    2:12 PM  Vitals with BMI  Weight  221 lbs 13 oz 218 lbs 10 oz  Systolic 140 127 253  Diastolic 86 90 87  Pulse  95 92   Exam chaperoned by Harlene Salts, CMA Gen: Alert, well appearing.  Patient is oriented to person, place, time, and situation. AFFECT: pleasant, lucid thought and speech. ENT: Ears: EACs clear, normal epithelium.  TMs with good light reflex and landmarks bilaterally.  Eyes: no injection, icteris, swelling, or exudate.  EOMI, PERRLA. Nose: no drainage or turbinate edema/swelling.  No injection or focal lesion.  Mouth: lips without lesion/swelling.  Oral mucosa pink and moist.  Dentition intact and without obvious caries or gingival swelling.  Oropharynx without erythema, exudate, or swelling.  Neck: supple/nontender.  No LAD, mass, or TM.  Carotid pulses 2+ bilaterally, without bruits. CV: RRR, no m/r/g.    LUNGS: CTA bilat, nonlabored resps, good aeration in all lung fields. ABD: soft, NT, ND, BS normal.  No hepatospenomegaly or mass.  No bruits. EXT: no clubbing, cyanosis, or edema.  Musculoskeletal: no joint swelling, erythema, warmth, or tenderness.  ROM of all joints intact. Skin - no sores or suspicious lesions or rashes or color changes  Pertinent labs:  Lab Results  Component Value Date   TSH 1.18 08/08/2022   Lab Results  Component Value Date   WBC 9.9 08/08/2022   HGB 14.3 08/08/2022   HCT 43.9 08/08/2022   MCV 85.4 08/08/2022   PLT 318 08/08/2022   Lab Results  Component Value Date   CREATININE 0.93 08/08/2022   BUN 9 08/08/2022   NA 140 08/08/2022   K 4.4 08/08/2022   CL 105 08/08/2022   CO2 25 08/08/2022   Lab Results  Component Value Date   ALT 22 08/08/2022   AST 15 08/08/2022   ALKPHOS 56 04/02/2021   BILITOT 0.3 08/08/2022   Lab Results  Component Value Date   CHOL 178 08/08/2022   Lab Results  Component Value Date   HDL 43 (L) 08/08/2022   Lab Results  Component Value Date   LDLCALC 100 (H) 08/08/2022   Lab Results  Component Value Date   TRIG 232 (H) 08/08/2022   Lab Results  Component Value Date   CHOLHDL 4.1 08/08/2022   Lab Results  Component Value Date   HGBA1C 8.2 (A) 04/30/2023   HGBA1C 8.2 04/30/2023   HGBA1C 8.2 (A) 04/30/2023   HGBA1C 8.2 (A) 04/30/2023   ASSESSMENT AND PLAN:   #1 health maintenance exam: Reviewed age and gender appropriate health maintenance issues (prudent diet, regular exercise, health risks of tobacco and excessive alcohol, use of seatbelts, fire alarms in home, use of sunscreen).  Also reviewed age and gender appropriate health screening as well as vaccine recommendations. Vaccines: Flu->UTD.  Otherwise all UTD.  Labs: fasting HP, a1c Cervical ca screening: per gYN Dr. Arelia Sneddon Breast ca screening: mammogram will be done through her employer hosp. Colon ca screening: average risk patient= as per  latest guidelines, start screening at 40 yrs of age.  #2 diabetes without complication, poor control. Point-of-care hemoglobin A1c today is 8.2%. She cannot tolerate more than 500 mg of metformin once a day. I gave her a list of the GLP-1 agonist and the SGLP 2 inhibitors to ask her insurer about coverage. If no coverage for any of these then will add glipizide xl.  #3  Hypertension. Start lisinopril 10 mg a day. Electrolytes and creatinine today.  #4 mixed hyperlipidemia. Not currently on statin. Continue fenofibrate 145 mg daily. Lipid panel and hepatic panel today. Likely will get her on statin in the near future but I feel like she has enough on her plate trying to get her glucoses and blood pressures controlled at this point in time.  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  Pain is much better since having cervical fusion surgery in June this year.  Hopefully her numbness in her hands will gradually improve as well.  An After Visit Summary was printed and given to the patient.  FOLLOW UP:  Return in about 2 weeks (around 05/14/2023) for f/u BP.  Signed:  Santiago Bumpers, MD           04/30/2023

## 2023-04-30 ENCOUNTER — Ambulatory Visit (INDEPENDENT_AMBULATORY_CARE_PROVIDER_SITE_OTHER): Payer: Managed Care, Other (non HMO) | Admitting: Family Medicine

## 2023-04-30 ENCOUNTER — Encounter: Payer: Self-pay | Admitting: Family Medicine

## 2023-04-30 ENCOUNTER — Telehealth: Payer: Self-pay

## 2023-04-30 ENCOUNTER — Other Ambulatory Visit: Payer: Self-pay | Admitting: Family Medicine

## 2023-04-30 VITALS — BP 140/86 | HR 95 | Temp 98.5°F | Wt 221.8 lb

## 2023-04-30 DIAGNOSIS — E119 Type 2 diabetes mellitus without complications: Secondary | ICD-10-CM

## 2023-04-30 DIAGNOSIS — Z1231 Encounter for screening mammogram for malignant neoplasm of breast: Secondary | ICD-10-CM

## 2023-04-30 DIAGNOSIS — Z0000 Encounter for general adult medical examination without abnormal findings: Secondary | ICD-10-CM

## 2023-04-30 DIAGNOSIS — I10 Essential (primary) hypertension: Secondary | ICD-10-CM

## 2023-04-30 DIAGNOSIS — E781 Pure hyperglyceridemia: Secondary | ICD-10-CM

## 2023-04-30 DIAGNOSIS — G8929 Other chronic pain: Secondary | ICD-10-CM

## 2023-04-30 DIAGNOSIS — Z7984 Long term (current) use of oral hypoglycemic drugs: Secondary | ICD-10-CM | POA: Diagnosis not present

## 2023-04-30 LAB — CBC WITH DIFFERENTIAL/PLATELET
Basophils Absolute: 0.1 10*3/uL (ref 0.0–0.1)
Basophils Relative: 1.2 % (ref 0.0–3.0)
Eosinophils Absolute: 0.5 10*3/uL (ref 0.0–0.7)
Eosinophils Relative: 5.2 % — ABNORMAL HIGH (ref 0.0–5.0)
HCT: 44.9 % (ref 36.0–46.0)
Hemoglobin: 14.8 g/dL (ref 12.0–15.0)
Lymphocytes Relative: 30.8 % (ref 12.0–46.0)
Lymphs Abs: 2.8 10*3/uL (ref 0.7–4.0)
MCHC: 33 g/dL (ref 30.0–36.0)
MCV: 86 fL (ref 78.0–100.0)
Monocytes Absolute: 0.6 10*3/uL (ref 0.1–1.0)
Monocytes Relative: 6.4 % (ref 3.0–12.0)
Neutro Abs: 5.2 10*3/uL (ref 1.4–7.7)
Neutrophils Relative %: 56.4 % (ref 43.0–77.0)
Platelets: 277 10*3/uL (ref 150.0–400.0)
RBC: 5.22 Mil/uL — ABNORMAL HIGH (ref 3.87–5.11)
RDW: 13.5 % (ref 11.5–15.5)
WBC: 9.1 10*3/uL (ref 4.0–10.5)

## 2023-04-30 LAB — COMPREHENSIVE METABOLIC PANEL
ALT: 36 U/L — ABNORMAL HIGH (ref 0–35)
AST: 23 U/L (ref 0–37)
Albumin: 4.3 g/dL (ref 3.5–5.2)
Alkaline Phosphatase: 67 U/L (ref 39–117)
BUN: 12 mg/dL (ref 6–23)
CO2: 28 meq/L (ref 19–32)
Calcium: 9.4 mg/dL (ref 8.4–10.5)
Chloride: 102 meq/L (ref 96–112)
Creatinine, Ser: 0.89 mg/dL (ref 0.40–1.20)
GFR: 78.99 mL/min (ref >60.00)
Glucose, Bld: 162 mg/dL — ABNORMAL HIGH (ref 70–99)
Potassium: 4.2 meq/L (ref 3.5–5.1)
Sodium: 139 meq/L (ref 135–145)
Total Bilirubin: 0.6 mg/dL (ref 0.2–1.2)
Total Protein: 7.1 g/dL (ref 6.0–8.3)

## 2023-04-30 LAB — LIPID PANEL
Cholesterol: 229 mg/dL — ABNORMAL HIGH (ref 0–200)
HDL: 45.1 mg/dL (ref >39.00)
LDL Cholesterol: 143 mg/dL — ABNORMAL HIGH (ref 0–99)
NonHDL: 183.89
Total CHOL/HDL Ratio: 5
Triglycerides: 202 mg/dL — ABNORMAL HIGH (ref 0.0–149.0)
VLDL: 40.4 mg/dL — ABNORMAL HIGH (ref 0.0–40.0)

## 2023-04-30 LAB — POCT GLYCOSYLATED HEMOGLOBIN (HGB A1C)
HbA1c POC (<> result, manual entry): 8.2 % (ref 4.0–5.6)
HbA1c, POC (controlled diabetic range): 8.2 % — AB (ref 0.0–7.0)
HbA1c, POC (prediabetic range): 8.2 % — AB (ref 5.7–6.4)
Hemoglobin A1C: 8.2 % — AB (ref 4.0–5.6)

## 2023-04-30 LAB — TSH: TSH: 1.64 u[IU]/mL (ref 0.35–5.50)

## 2023-04-30 MED ORDER — SEMAGLUTIDE(0.25 OR 0.5MG/DOS) 2 MG/3ML ~~LOC~~ SOPN: 0.5000 mg | PEN_INJECTOR | SUBCUTANEOUS | 0 refills | Status: DC

## 2023-04-30 MED ORDER — LISINOPRIL 10 MG PO TABS: 10.0000 mg | ORAL_TABLET | Freq: Every day | ORAL | 0 refills | Status: DC

## 2023-04-30 NOTE — Patient Instructions (Addendum)
Call your insurer and ask which of these are preferred: Ozempic, saxenda, trulicity, or mounjaro.  Also check on jardiance, farxiga, and invokana (pills).

## 2023-04-30 NOTE — Telephone Encounter (Signed)
Please do prescription for Ozempic 0.5 mg subcu weekly, 1 month supply.  She has tried and failed sulfonylurea and metformin.

## 2023-04-30 NOTE — Telephone Encounter (Signed)
A user error has taken place: encounter opened in error, closed for administrative reasons.

## 2023-05-01 ENCOUNTER — Other Ambulatory Visit (HOSPITAL_COMMUNITY): Payer: Self-pay

## 2023-05-01 ENCOUNTER — Other Ambulatory Visit: Payer: Self-pay

## 2023-05-01 ENCOUNTER — Telehealth: Payer: Self-pay

## 2023-05-01 MED ORDER — RIZATRIPTAN BENZOATE 10 MG PO TABS: 10.0000 mg | ORAL_TABLET | ORAL | 6 refills | Status: DC | PRN

## 2023-05-01 MED ORDER — FENOFIBRATE 145 MG PO TABS: 145.0000 mg | ORAL_TABLET | Freq: Every day | ORAL | 1 refills | Status: DC

## 2023-05-01 NOTE — Telephone Encounter (Signed)
See my chart message

## 2023-05-01 NOTE — Telephone Encounter (Signed)
Pharmacy Patient Advocate Encounter   Received notification from Patient Advice Request messages that prior authorization for Ozempic (0.25 or 0.5 MG/DOSE) 2MG /3ML pen-injectors is required/requested.   Insurance verification completed.   The patient is insured through [facility] .   Per test claim: PA required; PA submitted to above mentioned insurance via CoverMyMeds Key/confirmation #/EOC [id number] Status is pending

## 2023-05-01 NOTE — Telephone Encounter (Signed)
No further action needed.

## 2023-05-01 NOTE — Telephone Encounter (Signed)
Pharmacy Patient Advocate Encounter  Received notification from [facility] that Prior Authorization for Ozempic (0.25 or 0.5 MG/DOSE) 2MG /3ML pen-injectors  has been APPROVED from 05/01/23 to 04/30/24. Ran test claim, Copay is $295. This test claim was processed through [facility]- copay amounts may vary at other pharmacies due to pharmacy/plan contracts, or as the patient moves through the different stages of their insurance plan.   PA #/Case ID/Reference #: [id number]

## 2023-05-01 NOTE — Telephone Encounter (Signed)
See other encounter.

## 2023-05-13 ENCOUNTER — Encounter: Payer: Self-pay | Admitting: Family Medicine

## 2023-05-13 NOTE — Telephone Encounter (Signed)
 No further action needed at this time.

## 2023-05-14 ENCOUNTER — Encounter: Payer: Self-pay | Admitting: Family Medicine

## 2023-05-14 ENCOUNTER — Ambulatory Visit: Payer: Managed Care, Other (non HMO) | Admitting: Family Medicine

## 2023-05-14 VITALS — BP 117/82 | HR 89 | Wt 217.2 lb

## 2023-05-14 DIAGNOSIS — I10 Essential (primary) hypertension: Secondary | ICD-10-CM | POA: Diagnosis not present

## 2023-05-14 DIAGNOSIS — Z7985 Long-term (current) use of injectable non-insulin antidiabetic drugs: Secondary | ICD-10-CM

## 2023-05-14 DIAGNOSIS — E782 Mixed hyperlipidemia: Secondary | ICD-10-CM

## 2023-05-14 DIAGNOSIS — E119 Type 2 diabetes mellitus without complications: Secondary | ICD-10-CM | POA: Diagnosis not present

## 2023-05-14 DIAGNOSIS — Z7984 Long term (current) use of oral hypoglycemic drugs: Secondary | ICD-10-CM

## 2023-05-14 MED ORDER — SEMAGLUTIDE(0.25 OR 0.5MG/DOS) 2 MG/3ML ~~LOC~~ SOPN: 0.5000 mg | PEN_INJECTOR | SUBCUTANEOUS | 2 refills | Status: DC

## 2023-05-14 MED ORDER — RIZATRIPTAN BENZOATE 10 MG PO TABS: 10.0000 mg | ORAL_TABLET | ORAL | 6 refills | Status: DC | PRN

## 2023-05-14 MED ORDER — METFORMIN HCL 500 MG PO TABS: 500.0000 mg | ORAL_TABLET | Freq: Two times a day (BID) | ORAL | 1 refills | Status: DC

## 2023-05-14 MED ORDER — LISINOPRIL 10 MG PO TABS: 10.0000 mg | ORAL_TABLET | Freq: Every day | ORAL | 1 refills | Status: DC

## 2023-05-14 MED ORDER — FENOFIBRATE 145 MG PO TABS: 145.0000 mg | ORAL_TABLET | Freq: Every day | ORAL | 1 refills | Status: DC

## 2023-05-14 NOTE — Progress Notes (Signed)
 OFFICE VISIT  05/14/2023  CC:  Chief Complaint  Patient presents with   Hypertension    Patient is a 43 y.o. female who presents for 2-week follow-up hypertension and diabetes. A/P as of last visit: #1 health maintenance exam: Reviewed age and gender appropriate health maintenance issues (prudent diet, regular exercise, health risks of tobacco and excessive alcohol, use of seatbelts, fire alarms in home, use of sunscreen).  Also reviewed age and gender appropriate health screening as well as vaccine recommendations. Vaccines: Flu->UTD.  Otherwise all UTD.  Labs: fasting HP, a1c Cervical ca screening: per gYN Dr. 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 Colon ca screening: average risk patient= as per latest guidelines, start screening at 15 yrs of age.   #2 diabetes without complication, poor control. Point-of-care hemoglobin A1c today is 8.2%. She cannot tolerate more than 500 mg of metformin  once a day. I gave her a list of the GLP-1 agonist and the SGLP 2 inhibitors to ask her insurer about coverage. If no coverage for any of these then will add glipizide xl.   #3  Hypertension. Start lisinopril  10 mg a day. Electrolytes and creatinine today.   #4 mixed hyperlipidemia. Not currently on statin. Continue fenofibrate  145 mg daily. Lipid panel and hepatic panel today. Likely will get her on statin in the near future but I feel like she has enough on her plate trying to get her glucoses and blood pressures controlled at this point in time.   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  Pain is much better since having cervical fusion surgery in June this year.  Hopefully her numbness in her hands will gradually improve as well.  INTERIM HX: [employer] feels well.  Due to some pharmacy/insurance issues she has not picked up her Ozempic  yet but plans to start it any day now. She continues to take a cinnamon tablet every day and a 500  mg metformin  tab twice a day. No home glucose monitoring. No blood pressure monitoring at home either.   Past Medical History:  Diagnosis Date   Anxiety    Cervical spinal cord compression (HCC)    Cervical spondylosis    Compression fracture of thoracic vertebra (HCC)    Mild; T12   Degenerative disc disease    Cervical, thoracic, lumbar   Depression    Situational   DM (diabetes mellitus), type 2 (HCC) 05/2017   new dx 05/14/17---nonfasting gluc >200, HbA1c 7.6%.   Eclampsia    Seizure 1 wk after delivery of 3rd child.   GERD (gastroesophageal reflux disease)    Helicobacter pylori gastritis 05/2017   Dx'd by clinical presentation + H pylori IgG positive.   Hemorrhoids    History of chronic sinusitis 2012   Hx of parotitis 10/2016   Left sided: dx'd at ED in [facility], rx'd keflex and flagyl  and recovered uneventfully.   Hypertriglyceridemia 08/2018   Lumbar spondylosis    Migraine    Quiescent since her mid 7s.  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 Mavis.   Vitamin D  deficiency     Past Surgical History:  Procedure Laterality Date   CARDIOVASCULAR STRESS TEST  11/14/2020   NORMAL/LOW RISK   CHOLECYSTECTOMY N/A 07/05/2015  Procedure: LAPAROSCOPIC CHOLECYSTECTOMY WITH INTRAOPERATIVE CHOLANGIOGRAM;  Surgeon: Donnice Lima, MD;  Location: [facility] OR;  Service: General;  Laterality: N/A;   dexa  05/2017   Normal   DILATION AND EVACUATION  1999   WISDOM TOOTH EXTRACTION      Outpatient Medications Prior to Visit  Medication Sig Dispense Refill   blood glucose meter kit and supplies KIT Dispense based on patient and insurance preference. Use up to four times daily as directed. 1 each 0   cholecalciferol (VITAMIN D ) 1000 units tablet Take 3,000 Units by  mouth daily.     Fexofenadine HCl (ALLEGRA PO) Take by mouth as needed.     fluticasone  (FLONASE ) 50 MCG/ACT nasal spray Place into both nostrils daily.     Lancets (ONETOUCH DELICA PLUS LANCET33G) MISC SMARTSIG:1 Topical 4 Times Daily     levonorgestrel (MIRENA) 20 MCG/24HR IUD 1 each by Intrauterine route once.     omeprazole (PRILOSEC OTC) 20 MG tablet Take 40 mg by mouth daily.     ONETOUCH VERIO test strip 1 each 4 (four) times daily.     fenofibrate  (TRICOR ) 145 MG tablet Take 1 tablet (145 mg total) by mouth daily. 90 tablet 1   lisinopril  (ZESTRIL ) 10 MG tablet Take 1 tablet (10 mg total) by mouth daily. 30 tablet 0   metaxalone  (SKELAXIN ) 800 MG tablet Take 1 tablet (800 mg total) by mouth 4 (four) times daily as needed. 120 tablet 5   metFORMIN  (GLUCOPHAGE ) 500 MG tablet TAKE 1 TABLET BY MOUTH TWICE DAILY WITH A MEAL 60 tablet 0   mupirocin  ointment (BACTROBAN ) 2 % Apply 1 application topically 3 (three) times daily. 15 g 0   ofloxacin  (FLOXIN  OTIC) 0.3 % OTIC solution 10 drops in R ear once daily x 5d 5 mL 0   rizatriptan  (MAXALT ) 10 MG tablet Take 1 tablet (10 mg total) by mouth as needed for migraine. May repeat in 2 hours if needed 10 tablet 6   Semaglutide ,0.25 or 0.5MG /DOS, 2 MG/3ML SOPN Inject 0.5 mg into the skin once a week. 3 mL 0   No facility-administered medications prior to visit.    Allergies  Allergen Reactions   Prednisone  Other (See Comments)    Insomnia, cognitive clouding, like I'm going crazy   Tramadol Other (See Comments)    Per pt - unable describe feeling, just not well   Penicillins Rash    Has patient had a PCN reaction causing immediate rash, facial/tongue/throat swelling, SOB or lightheadedness with hypotension: unknown, childhood reaction    Review of Systems As per HPI  PE:    05/14/2023   10:10 AM 04/30/2023    9:06 AM 04/30/2023    9:05 AM  Vitals with BMI  Weight 217 lbs 3 oz  221 lbs 13 oz  Systolic 117 140 872  Diastolic 82 86 90   Pulse 89  95     Physical Exam  Gen: Alert, well appearing.  Patient is oriented to person, place, time, and situation. AFFECT: pleasant, lucid thought and speech. No further exam today  LABS:  Last CBC Lab Results  Component Value Date   WBC 9.1 04/30/2023   HGB 14.8 04/30/2023   HCT 44.9 04/30/2023   MCV 86.0 04/30/2023   MCH 27.8 08/08/2022   RDW 13.5 04/30/2023   PLT 277.0 04/30/2023   Last metabolic panel Lab Results  Component Value Date   GLUCOSE 162 (H) 04/30/2023   NA 139 04/30/2023   K 4.2 04/30/2023  CL 102 04/30/2023   CO2 28 04/30/2023   BUN 12 04/30/2023   CREATININE 0.89 04/30/2023   GFR 78.99 04/30/2023   CALCIUM 9.4 04/30/2023   PROT 7.1 04/30/2023   ALBUMIN 4.3 04/30/2023   BILITOT 0.6 04/30/2023   ALKPHOS 67 04/30/2023   AST 23 04/30/2023   ALT 36 (H) 04/30/2023   Last lipids Lab Results  Component Value Date   CHOL 229 (H) 04/30/2023   HDL 45.10 04/30/2023   LDLCALC 143 (H) 04/30/2023   LDLDIRECT 115.0 04/02/2021   TRIG 202.0 (H) 04/30/2023   CHOLHDL 5 04/30/2023   Last hemoglobin A1c Lab Results  Component Value Date   HGBA1C 8.2 (A) 04/30/2023   HGBA1C 8.2 04/30/2023   HGBA1C 8.2 (A) 04/30/2023   HGBA1C 8.2 (A) 04/30/2023   Last thyroid functions Lab Results  Component Value Date   TSH 1.64 04/30/2023   Last vitamin D  Lab Results  Component Value Date   VD25OH 43 08/08/2022   IMPRESSION AND PLAN:  #1 hypertension, well-controlled on lisinopril  10 mg a day.  #2 diabetes without complication. She will start Ozempic  0.5 mg weekly any day now. Continue metformin  500 mg twice a day.  She also finds a cinnamon tablet every day very helpful.  #3 mixed hyperlipidemia. Not currently on statin. Continue fenofibrate  145 mg daily. Likely will get her on statin in the near future but I feel like she has enough on her plate trying to get her glucoses and blood pressures controlled at this point in time.  An After Visit  Summary was printed and given to the patient.  FOLLOW UP: Return in about 3 months (around 08/12/2023) for routine chronic illness f/u.  Signed:  Gerlene Hockey, MD           05/14/2023

## 2023-05-26 ENCOUNTER — Other Ambulatory Visit: Payer: Self-pay | Admitting: Family Medicine

## 2023-08-27 LAB — HM PAP SMEAR: HPV, high-risk: NEGATIVE

## 2023-08-27 LAB — RESULTS CONSOLE HPV: CHL HPV: NEGATIVE

## 2023-11-12 ENCOUNTER — Other Ambulatory Visit: Payer: Self-pay

## 2023-12-04 ENCOUNTER — Other Ambulatory Visit: Payer: Self-pay

## 2023-12-04 ENCOUNTER — Encounter: Payer: Self-pay | Admitting: Family Medicine

## 2023-12-04 ENCOUNTER — Ambulatory Visit (INDEPENDENT_AMBULATORY_CARE_PROVIDER_SITE_OTHER): Payer: Self-pay | Admitting: Family Medicine

## 2023-12-04 VITALS — BP 110/76 | HR 88 | Temp 98.1°F | Ht 67.0 in | Wt 208.6 lb

## 2023-12-04 DIAGNOSIS — E781 Pure hyperglyceridemia: Secondary | ICD-10-CM | POA: Diagnosis not present

## 2023-12-04 DIAGNOSIS — Z7984 Long term (current) use of oral hypoglycemic drugs: Secondary | ICD-10-CM | POA: Diagnosis not present

## 2023-12-04 DIAGNOSIS — I10 Essential (primary) hypertension: Secondary | ICD-10-CM | POA: Diagnosis not present

## 2023-12-04 DIAGNOSIS — E119 Type 2 diabetes mellitus without complications: Secondary | ICD-10-CM

## 2023-12-04 LAB — COMPREHENSIVE METABOLIC PANEL WITH GFR
ALT: 20 U/L (ref 0–35)
AST: 13 U/L (ref 0–37)
Albumin: 4.3 g/dL (ref 3.5–5.2)
Alkaline Phosphatase: 44 U/L (ref 39–117)
BUN: 12 mg/dL (ref 6–23)
CO2: 27 meq/L (ref 19–32)
Calcium: 9.4 mg/dL (ref 8.4–10.5)
Chloride: 105 meq/L (ref 96–112)
Creatinine, Ser: 0.85 mg/dL (ref 0.40–1.20)
GFR: 83.12 mL/min (ref >60.00)
Glucose, Bld: 121 mg/dL — ABNORMAL HIGH (ref 70–99)
Potassium: 4.4 meq/L (ref 3.5–5.1)
Sodium: 140 meq/L (ref 135–145)
Total Bilirubin: 0.4 mg/dL (ref 0.2–1.2)
Total Protein: 7.1 g/dL (ref 6.0–8.3)

## 2023-12-04 LAB — LIPID PANEL
Cholesterol: 165 mg/dL (ref 0–200)
HDL: 41.6 mg/dL (ref >39.00)
LDL Cholesterol: 97 mg/dL (ref 0–99)
NonHDL: 123.38
Total CHOL/HDL Ratio: 4
Triglycerides: 130 mg/dL (ref 0.0–149.0)
VLDL: 26 mg/dL (ref 0.0–40.0)

## 2023-12-04 LAB — MICROALBUMIN / CREATININE URINE RATIO
Creatinine,U: 101.6 mg/dL
Microalb Creat Ratio: UNDETERMINED mg/g (ref 0.0–30.0)
Microalb, Ur: <0.7 mg/dL

## 2023-12-04 LAB — POCT GLYCOSYLATED HEMOGLOBIN (HGB A1C)
HbA1c POC (<> result, manual entry): 6.5 % (ref 4.0–5.6)
HbA1c, POC (controlled diabetic range): 6.5 % (ref 0.0–7.0)
HbA1c, POC (prediabetic range): 6.5 % — AB (ref 5.7–6.4)
Hemoglobin A1C: 6.5 % — AB (ref 4.0–5.6)

## 2023-12-04 MED ORDER — FENOFIBRATE 145 MG PO TABS: 145.0000 mg | ORAL_TABLET | Freq: Every day | ORAL | 0 refills | Status: DC

## 2023-12-04 MED ORDER — LISINOPRIL 10 MG PO TABS: 10.0000 mg | ORAL_TABLET | Freq: Every day | ORAL | 0 refills | Status: DC

## 2023-12-04 MED ORDER — METFORMIN HCL 500 MG PO TABS: 500.0000 mg | ORAL_TABLET | Freq: Two times a day (BID) | ORAL | 0 refills | Status: DC

## 2023-12-04 MED ORDER — RIZATRIPTAN BENZOATE 10 MG PO TABS: 10.0000 mg | ORAL_TABLET | ORAL | 0 refills | Status: DC | PRN

## 2023-12-04 NOTE — Progress Notes (Signed)
 OFFICE VISIT  12/04/2023  CC:  Chief Complaint  Patient presents with   Medical Management of Chronic Issues    Patient is a 43 y.o. female who presents for 34-month follow-up diabetes, hypertension, and hypertriglyceridemia. A/P as of last visit: #1 hypertension, well-controlled on lisinopril  10 mg a day.   #2 diabetes without complication. She will start Ozempic  0.5 mg weekly any day now. Continue metformin  500 mg twice a day.  She also finds a cinnamon tablet every day very helpful.   #3 mixed hyperlipidemia. Not currently on statin. Continue fenofibrate  145 mg daily. Likely will get her on statin in the near future but I feel like she has enough on her plate trying to get her glucoses and blood pressures controlled at this point in time.  INTERIM HX: She has been on the ozempic  for 7 weeks now. Tolerating it w/out prob.  She feels well.   ROS --> no fevers, no CP, no SOB, no wheezing, no cough, no dizziness, no HAs, no rashes, no melena/hematochezia.  No polyuria or polydipsia.  No myalgias or arthralgias.  No focal weakness, paresthesias, or tremors.  No acute vision or hearing abnormalities.  No dysuria or unusual/new urinary urgency or frequency.  No recent changes in lower legs. No n/v/d or abd pain.  No palpitations.    Past Medical History:  Diagnosis Date   Anxiety    Cervical spinal cord compression (HCC)    Cervical spondylosis    Compression fracture of thoracic vertebra (HCC)    Mild; T12   Degenerative disc disease    Cervical, thoracic, lumbar   Depression    Situational   DM (diabetes mellitus), type 2 (HCC) 05/2017   new dx 05/14/17---nonfasting gluc >200, HbA1c 7.6%.   Eclampsia    Seizure 1 wk after delivery of 3rd child.   GERD (gastroesophageal reflux disease)    Helicobacter pylori gastritis 05/2017   Dx'd by clinical presentation + H pylori IgG positive.   Hemorrhoids    History of chronic sinusitis 2012   Hx of parotitis 10/2016   Left  sided: dx'd at ED in [facility], rx'd keflex and flagyl  and recovered uneventfully.   Hypertriglyceridemia 08/2018   Lumbar spondylosis    Migraine    Quiescent since her mid 24s.  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 Mavis.   Vitamin D  deficiency     Past Surgical History:  Procedure Laterality Date   CARDIOVASCULAR STRESS TEST  11/14/2020   NORMAL/LOW RISK   CHOLECYSTECTOMY N/A 07/05/2015   Procedure: LAPAROSCOPIC CHOLECYSTECTOMY WITH INTRAOPERATIVE CHOLANGIOGRAM;  Surgeon: Donnice Lima, MD;  Location: [facility] OR;  Service: General;  Laterality: N/A;   dexa  05/2017   Normal   DILATION AND EVACUATION  1999   WISDOM TOOTH EXTRACTION      Outpatient Medications Prior to Visit  Medication Sig Dispense Refill   BIOTIN PO Take by mouth daily.     blood glucose meter kit and supplies KIT Dispense based on patient and insurance preference. Use up to four times daily as directed. 1 each 0   cholecalciferol (VITAMIN D ) 1000 units tablet Take 3,000 Units by mouth daily.     fenofibrate  (TRICOR ) 145 MG tablet Take 1 tablet (145 mg total) by mouth  daily. 90 tablet 1   Fexofenadine HCl (ALLEGRA PO) Take by mouth as needed.     Lancets (ONETOUCH DELICA PLUS LANCET33G) MISC SMARTSIG:1 Topical 4 Times Daily     levonorgestrel (MIRENA) 20 MCG/24HR IUD 1 each by Intrauterine route once.     lisinopril  (ZESTRIL ) 10 MG tablet Take 1 tablet (10 mg total) by mouth daily. 90 tablet 1   metFORMIN  (GLUCOPHAGE ) 500 MG tablet Take 1 tablet (500 mg total) by mouth 2 (two) times daily with a meal. 180 tablet 1   ONETOUCH VERIO test strip 1 each 4 (four) times daily.     rizatriptan  (MAXALT ) 10 MG tablet Take 1 tablet (10 mg total) by mouth as needed for migraine. May repeat in 2 hours if needed  10 tablet 6   Semaglutide ,0.25 or 0.5MG /DOS, 2 MG/3ML SOPN Inject 0.5 mg into the skin once a week. 3 mL 2   fluticasone  (FLONASE ) 50 MCG/ACT nasal spray Place into both nostrils daily. (Patient not taking: Reported on 12/04/2023)     omeprazole (PRILOSEC OTC) 20 MG tablet Take 40 mg by mouth daily. (Patient not taking: Reported on 12/04/2023)     No facility-administered medications prior to visit.    Allergies  Allergen Reactions   Prednisone  Other (See Comments)    Insomnia, cognitive clouding, like I'm going crazy   Tramadol Other (See Comments)    Per pt - unable describe feeling, just not well   Penicillins Rash    Has patient had a PCN reaction causing immediate rash, facial/tongue/throat swelling, SOB or lightheadedness with hypotension: unknown, childhood reaction    Review of Systems As per HPI  PE:    12/04/2023    8:17 AM 05/14/2023   10:10 AM 04/30/2023    9:06 AM  Vitals with BMI  Height 5' 7    Weight 208 lbs 10 oz 217 lbs 3 oz   BMI 32.66    Systolic 110 117 859  Diastolic 76 82 86  Pulse 88 89      Physical Exam  Gen: Alert, well appearing.  Patient is oriented to person, place, time, and situation. AFFECT: pleasant, lucid thought and speech. CV: RRR, no m/r/g.   LUNGS: CTA bilat, nonlabored resps, good aeration in all lung fields. EXT: no clubbing or cyanosis.  No edema.   LABS:  Last CBC Lab Results  Component Value Date   WBC 9.1 04/30/2023   HGB 14.8 04/30/2023   HCT 44.9 04/30/2023   MCV 86.0 04/30/2023   MCH 27.8 08/08/2022   RDW 13.5 04/30/2023   PLT 277.0 04/30/2023   Last metabolic panel Lab Results  Component Value Date   GLUCOSE 162 (H) 04/30/2023   NA 139 04/30/2023   K 4.2 04/30/2023   CL 102 04/30/2023   CO2 28 04/30/2023   BUN 12 04/30/2023   CREATININE 0.89 04/30/2023   GFR 78.99 04/30/2023   CALCIUM 9.4 04/30/2023   PROT 7.1 04/30/2023   ALBUMIN 4.3 04/30/2023   BILITOT 0.6 04/30/2023   ALKPHOS 67 04/30/2023   AST  23 04/30/2023   ALT 36 (H) 04/30/2023   Last lipids Lab Results  Component Value Date   CHOL 229 (H) 04/30/2023   HDL 45.10 04/30/2023   LDLCALC 143 (H) 04/30/2023   LDLDIRECT 115.0 04/02/2021   TRIG 202.0 (H) 04/30/2023   CHOLHDL 5 04/30/2023   Last hemoglobin A1c Lab Results  Component Value Date   HGBA1C 6.5 (A) 12/04/2023   HGBA1C 6.5 12/04/2023  HGBA1C 6.5 (A) 12/04/2023   HGBA1C 6.5 12/04/2023   Last thyroid functions Lab Results  Component Value Date   TSH 1.64 04/30/2023   Last vitamin D  Lab Results  Component Value Date   VD25OH 43 08/08/2022   IMPRESSION AND PLAN:  #1 hypertension, well-controlled on lisinopril  10 mg a day. Electrolytes and creatinine today.  #2 diabetes without complication. Much improved control. Hemoglobin A1c today is 6.5%. Continue metformin  500 mg a day, and Ozempic  0.5 mg subcu weekly. Urine microalbumin/creatinine today.  #3 mixed hyperlipidemia. Not currently on statin. Continue fenofibrate  145 mg daily. Likely will get her on statin in the near future. Lipid panel and hepatic panel today.   An After Visit Summary was printed and given to the patient.  FOLLOW UP: Return in about 3 months (around 03/05/2024) for routine chronic illness f/u. Next CPE around 04/29/2024   signed:  Gerlene Hockey, MD           12/04/2023

## 2023-12-07 ENCOUNTER — Ambulatory Visit: Payer: Self-pay | Admitting: Family Medicine

## 2023-12-14 ENCOUNTER — Other Ambulatory Visit: Payer: Self-pay

## 2023-12-14 ENCOUNTER — Encounter: Payer: Self-pay | Admitting: Family Medicine

## 2023-12-14 MED ORDER — SEMAGLUTIDE(0.25 OR 0.5MG/DOS) 2 MG/3ML ~~LOC~~ SOPN: 0.5000 mg | PEN_INJECTOR | SUBCUTANEOUS | 2 refills | Status: DC

## 2024-02-22 ENCOUNTER — Other Ambulatory Visit: Payer: Self-pay

## 2024-03-02 ENCOUNTER — Other Ambulatory Visit: Payer: Self-pay | Admitting: Family Medicine

## 2024-04-19 ENCOUNTER — Other Ambulatory Visit (HOSPITAL_COMMUNITY): Payer: Self-pay

## 2024-04-26 ENCOUNTER — Other Ambulatory Visit (HOSPITAL_COMMUNITY): Payer: Self-pay

## 2024-04-26 ENCOUNTER — Telehealth: Payer: Self-pay | Admitting: Pharmacy Technician

## 2024-04-26 NOTE — Telephone Encounter (Signed)
 Pharmacy Patient Advocate Encounter   Received notification from Onbase that prior authorization for Ozempic  (0.25 or 0.5 MG/DOSE) 2MG /3ML pen-injectors  is due for renewal.   Insurance verification completed.   The patient is insured through [facility]. Key: [id number]  Action: Medication is now available without a prior authorization.  **Will try submitting the PA again on Monday 05/02/24**

## 2024-05-04 ENCOUNTER — Other Ambulatory Visit (HOSPITAL_COMMUNITY): Payer: Self-pay

## 2024-05-04 NOTE — Telephone Encounter (Signed)
 Pharmacy Patient Advocate Encounter  Received notification from [facility] that Prior Authorization for Ozempic  (0.25 or 0.5 MG/DOSE) 2MG /3ML pen-injectors  has been CANCELLED due to No PA needed.     Archived key: [id number]

## 2024-05-19 ENCOUNTER — Encounter: Payer: Self-pay | Admitting: Family Medicine

## 2024-05-19 ENCOUNTER — Ambulatory Visit: Admitting: Family Medicine

## 2024-05-19 VITALS — BP 109/72 | HR 92 | Temp 98.8°F | Ht 67.0 in | Wt 199.6 lb

## 2024-05-19 DIAGNOSIS — Z7985 Long-term (current) use of injectable non-insulin antidiabetic drugs: Secondary | ICD-10-CM

## 2024-05-19 DIAGNOSIS — E7800 Pure hypercholesterolemia, unspecified: Secondary | ICD-10-CM | POA: Diagnosis not present

## 2024-05-19 DIAGNOSIS — Z7984 Long term (current) use of oral hypoglycemic drugs: Secondary | ICD-10-CM | POA: Diagnosis not present

## 2024-05-19 DIAGNOSIS — E119 Type 2 diabetes mellitus without complications: Secondary | ICD-10-CM

## 2024-05-19 DIAGNOSIS — I10 Essential (primary) hypertension: Secondary | ICD-10-CM

## 2024-05-19 LAB — CBC WITH DIFFERENTIAL/PLATELET
Basophils Absolute: 0.1 K/uL (ref 0.0–0.1)
Basophils Relative: 1.2 % (ref 0.0–3.0)
Eosinophils Absolute: 0.2 K/uL (ref 0.0–0.7)
Eosinophils Relative: 2.8 % (ref 0.0–5.0)
HCT: 43.8 % (ref 36.0–46.0)
Hemoglobin: 14.5 g/dL (ref 12.0–15.0)
Lymphocytes Relative: 25.5 % (ref 12.0–46.0)
Lymphs Abs: 2.2 K/uL (ref 0.7–4.0)
MCHC: 33.2 g/dL (ref 30.0–36.0)
MCV: 84.9 fl (ref 78.0–100.0)
Monocytes Absolute: 0.5 K/uL (ref 0.1–1.0)
Monocytes Relative: 5.9 % (ref 3.0–12.0)
Neutro Abs: 5.5 K/uL (ref 1.4–7.7)
Neutrophils Relative %: 64.6 % (ref 43.0–77.0)
Platelets: 316 K/uL (ref 150.0–400.0)
RBC: 5.16 Mil/uL — ABNORMAL HIGH (ref 3.87–5.11)
RDW: 13.6 % (ref 11.5–15.5)
WBC: 8.5 K/uL (ref 4.0–10.5)

## 2024-05-19 LAB — LIPID PANEL
Cholesterol: 185 mg/dL (ref 28–200)
HDL: 52 mg/dL
LDL Cholesterol: 106 mg/dL — ABNORMAL HIGH (ref 10–99)
NonHDL: 132.91
Total CHOL/HDL Ratio: 4
Triglycerides: 136 mg/dL (ref 10.0–149.0)
VLDL: 27.2 mg/dL (ref 0.0–40.0)

## 2024-05-19 LAB — BASIC METABOLIC PANEL WITH GFR
BUN: 11 mg/dL (ref 6–23)
CO2: 30 meq/L (ref 19–32)
Calcium: 10 mg/dL (ref 8.4–10.5)
Chloride: 101 meq/L (ref 96–112)
Creatinine, Ser: 0.87 mg/dL (ref 0.40–1.20)
GFR: 80.58 mL/min
Glucose, Bld: 114 mg/dL — ABNORMAL HIGH (ref 70–99)
Potassium: 4.5 meq/L (ref 3.5–5.1)
Sodium: 139 meq/L (ref 135–145)

## 2024-05-19 LAB — HEMOGLOBIN A1C: Hgb A1c MFr Bld: 6.6 % — ABNORMAL HIGH (ref 4.6–6.5)

## 2024-05-19 MED ORDER — FENOFIBRATE 145 MG PO TABS: 145.0000 mg | ORAL_TABLET | Freq: Every day | ORAL | 3 refills | Status: AC

## 2024-05-19 MED ORDER — METFORMIN HCL 500 MG PO TABS: 500.0000 mg | ORAL_TABLET | Freq: Two times a day (BID) | ORAL | 3 refills | Status: AC

## 2024-05-19 MED ORDER — LISINOPRIL 10 MG PO TABS: 10.0000 mg | ORAL_TABLET | Freq: Every day | ORAL | 3 refills | Status: AC

## 2024-05-19 MED ORDER — SEMAGLUTIDE(0.25 OR 0.5MG/DOS) 2 MG/3ML ~~LOC~~ SOPN: 0.5000 mg | PEN_INJECTOR | SUBCUTANEOUS | 3 refills | Status: AC

## 2024-05-19 MED ORDER — RIZATRIPTAN BENZOATE 10 MG PO TABS: 10.0000 mg | ORAL_TABLET | ORAL | 1 refills | Status: AC | PRN

## 2024-05-19 NOTE — Progress Notes (Signed)
 OFFICE VISIT  05/19/2024  CC:  Chief Complaint  Patient presents with   Sinus Issues    Pt has been having congestion, taking Mucinex  DM since Dec, sinus pills, and saline nasal spray    Patient is a 44 y.o. female who presents for 108-month follow-up diabetes, hypertension, and hyperlipidemia. A/P as of last visit: #1 hypertension, well-controlled on lisinopril  10 mg a day. Electrolytes and creatinine today.   #2 diabetes without complication. Much improved control. Hemoglobin A1c today is 6.5%. Continue metformin  500 mg a day, and Ozempic  0.5 mg subcu weekly. Urine microalbumin/creatinine today.   #3 mixed hyperlipidemia. Not currently on statin. Continue fenofibrate  145 mg daily. Likely will get her on statin in the near future. Lipid panel and hepatic panel today.  HPI: Doing well overall.  She has had some upper respiratory symptoms with cough for the last few weeks but notes that this is gradually getting better.  No face pain, no shortness of breath or wheezing, no fever.  She is happy with the semaglutide  0.5 mg weekly.  She has lost some weight and feels good.  ROS as above, plus-->  no CP,  no dizziness, no HAs, no rashes, no melena/hematochezia.  No polyuria or polydipsia.  No myalgias or arthralgias.  No focal weakness, paresthesias, or tremors.  No acute vision or hearing abnormalities.  No dysuria or unusual/new urinary urgency or frequency.  No recent changes in lower legs. No n/v/d or abd pain.  No palpitations.    Past Medical History:  Diagnosis Date   Anxiety    Cervical spinal cord compression (HCC)    Cervical spondylosis    Compression fracture of thoracic vertebra (HCC)    Mild; T12   Degenerative disc disease    Cervical, thoracic, lumbar   Depression    Situational   DM (diabetes mellitus), type 2 (HCC) 05/2017   new dx 05/14/17---nonfasting gluc >200, HbA1c 7.6%.   Eclampsia    Seizure 1 wk after delivery of 3rd child.   GERD  (gastroesophageal reflux disease)    Helicobacter pylori gastritis 05/2017   Dx'd by clinical presentation + H pylori IgG positive.   Hemorrhoids    History of chronic sinusitis 2012   Hx of parotitis 10/2016   Left sided: dx'd at ED in [facility], rx'd keflex and flagyl  and recovered uneventfully.   Hypertriglyceridemia 08/2018   Lumbar spondylosis    Migraine    Quiescent since her mid 68s.  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 Mavis.   Vitamin D  deficiency     Past Surgical History:  Procedure Laterality Date   CARDIOVASCULAR STRESS TEST  11/14/2020   NORMAL/LOW RISK   CHOLECYSTECTOMY N/A 07/05/2015   Procedure: LAPAROSCOPIC CHOLECYSTECTOMY WITH INTRAOPERATIVE CHOLANGIOGRAM;  Surgeon: Donnice Lima, MD;  Location: [facility] OR;  Service: General;  Laterality: N/A;   dexa  05/2017   Normal   DILATION AND EVACUATION  1999   WISDOM TOOTH EXTRACTION      Outpatient Medications Prior to Visit  Medication Sig Dispense Refill   BIOTIN PO Take by mouth daily.     Cetirizine HCl (ZYRTEC PO) Take by mouth daily.     cholecalciferol (VITAMIN D ) 1000 units tablet Take 3,000 Units by mouth daily.  levonorgestrel (MIRENA) 20 MCG/24HR IUD 1 each by Intrauterine route once.     NASAL SPRAY SALINE NA Place into the nose as needed.     omeprazole (PRILOSEC OTC) 20 MG tablet Take 40 mg by mouth daily. (Patient taking differently: Take 40 mg by mouth as needed.)     fenofibrate  (TRICOR ) 145 MG tablet Take 1 tablet (145 mg total) by mouth daily. 90 tablet 0   Fexofenadine HCl (ALLEGRA PO) Take by mouth as needed.     lisinopril  (ZESTRIL ) 10 MG tablet TAKE 1 TABLET(10 MG) BY MOUTH DAILY 90 tablet 0   metFORMIN  (GLUCOPHAGE ) 500 MG tablet TAKE 1 TABLET(500 MG) BY MOUTH TWICE DAILY  WITH A MEAL 180 tablet 0   rizatriptan  (MAXALT ) 10 MG tablet Take 1 tablet (10 mg total) by mouth as needed for migraine. May repeat in 2 hours if needed 10 tablet 0   Semaglutide ,0.25 or 0.5MG /DOS, 2 MG/3ML SOPN Inject 0.5 mg into the skin once a week. 3 mL 2   blood glucose meter kit and supplies KIT Dispense based on patient and insurance preference. Use up to four times daily as directed. (Patient not taking: Reported on 05/19/2024) 1 each 0   fluticasone  (FLONASE ) 50 MCG/ACT nasal spray Place into both nostrils daily. (Patient not taking: Reported on 12/04/2023)     Lancets (ONETOUCH DELICA PLUS LANCET33G) MISC SMARTSIG:1 Topical 4 Times Daily (Patient not taking: Reported on 05/19/2024)     ONETOUCH VERIO test strip 1 each 4 (four) times daily. (Patient not taking: Reported on 05/19/2024)     No facility-administered medications prior to visit.    Allergies[1]  Review of Systems  As per HPI  PE:    05/19/2024   11:03 AM 12/04/2023    8:17 AM 05/14/2023   10:10 AM  Vitals with BMI  Height 5' 7 5' 7   Weight 199 lbs 10 oz 208 lbs 10 oz 217 lbs 3 oz  BMI 31.25 32.66   Systolic 109 110 882  Diastolic 72 76 82  Pulse 92 88 89     Physical Exam  Gen: Alert, well appearing.  Patient is oriented to person, place, time, and situation. AFFECT: pleasant, lucid thought and speech. ZWU:Zbzd: no injection, icteris, swelling, or exudate.  EOMI, PERRLA. Mouth: lips without lesion/swelling.  Oral mucosa pink and moist. Oropharynx without erythema, exudate, or swelling.  Neck: no adenopathy CV: RRR, no m/r/g.   LUNGS: CTA bilat, nonlabored resps, good aeration in all lung fields.  LABS:  Last metabolic panel Lab Results  Component Value Date   GLUCOSE 121 (H) 12/04/2023   NA 140 12/04/2023   K 4.4 12/04/2023   CL 105 12/04/2023   CO2 27 12/04/2023   BUN 12 12/04/2023   CREATININE 0.85 12/04/2023   GFR 83.12 12/04/2023   CALCIUM 9.4 12/04/2023   PROT 7.1 12/04/2023   ALBUMIN 4.3  12/04/2023   BILITOT 0.4 12/04/2023   ALKPHOS 44 12/04/2023   AST 13 12/04/2023   ALT 20 12/04/2023   Lab Results  Component Value Date   HGBA1C 6.5 (A) 12/04/2023   HGBA1C 6.5 12/04/2023   HGBA1C 6.5 (A) 12/04/2023   HGBA1C 6.5 12/04/2023   Lab Results  Component Value Date   CHOL 165 12/04/2023   HDL 41.60 12/04/2023   LDLCALC 97 12/04/2023   LDLDIRECT 115.0 04/02/2021   TRIG 130.0 12/04/2023   CHOLHDL 4 12/04/2023   Lab Results  Component Value Date   WBC 9.1 04/30/2023  HGB 14.8 04/30/2023   HCT 44.9 04/30/2023   MCV 86.0 04/30/2023   PLT 277.0 04/30/2023    IMPRESSION AND PLAN:  #1 hypertension, well-controlled on lisinopril  10 mg a day. Electrolytes and creatinine today.   #2 diabetes without complication. Check hemoglobin A1c and renal function today. Continue metformin  500 mg a day, and Ozempic  0.5 mg subcu weekly.   #3 mixed hyperlipidemia. Not currently on statin. Continue fenofibrate  145 mg daily. Fasting lipid panel today.  #4 URI with cough.  Gradually resolving.  Continue over-the-counter symptomatic care.  An After Visit Summary was printed and given to the patient.  FOLLOW UP: Return in about 4 months (around 09/16/2024) for routine chronic illness f/u.  Signed:  Gerlene Hockey, MD           05/19/2024     [1]  Allergies Allergen Reactions   Prednisone  Other (See Comments)    Insomnia, cognitive clouding, like I'm going crazy   Tramadol Other (See Comments)    Per pt - unable describe feeling, just not well   Penicillins Rash    Has patient had a PCN reaction causing immediate rash, facial/tongue/throat swelling, SOB or lightheadedness with hypotension: unknown, childhood reaction

## 2024-05-22 ENCOUNTER — Ambulatory Visit: Payer: Self-pay | Admitting: Family Medicine

## 2024-05-24 ENCOUNTER — Telehealth: Payer: Self-pay

## 2024-05-24 NOTE — Telephone Encounter (Signed)
 MyChart message reminder sent regarding mammogram.

## 2024-06-06 ENCOUNTER — Other Ambulatory Visit: Payer: Self-pay | Admitting: Family Medicine
